# Patient Record
Sex: Male | Born: 1945 | Race: White | Hispanic: No | Marital: Married | Smoking: Former smoker
Health system: Southern US, Community
[De-identification: ages and names within clinical notes are randomized; demographics above are authoritative.]

## PROBLEM LIST (undated history)

## (undated) DIAGNOSIS — I10 Essential (primary) hypertension: Secondary | ICD-10-CM

## (undated) DIAGNOSIS — K602 Anal fissure, unspecified: Secondary | ICD-10-CM

## (undated) DIAGNOSIS — M1990 Unspecified osteoarthritis, unspecified site: Secondary | ICD-10-CM

## (undated) DIAGNOSIS — I219 Acute myocardial infarction, unspecified: Secondary | ICD-10-CM

## (undated) DIAGNOSIS — N200 Calculus of kidney: Secondary | ICD-10-CM

## (undated) DIAGNOSIS — J189 Pneumonia, unspecified organism: Secondary | ICD-10-CM

## (undated) HISTORY — PX: TONSILLECTOMY: SUR1361

---

## 2005-12-21 HISTORY — PX: JOINT REPLACEMENT: SHX530

## 2006-01-08 ENCOUNTER — Inpatient Hospital Stay (HOSPITAL_COMMUNITY): Admission: RE | Admit: 2006-01-08 | Discharge: 2006-01-10 | Payer: Self-pay | Admitting: Orthopedic Surgery

## 2006-01-14 ENCOUNTER — Emergency Department (HOSPITAL_COMMUNITY): Admission: EM | Admit: 2006-01-14 | Discharge: 2006-01-14 | Payer: Self-pay | Admitting: Emergency Medicine

## 2006-05-02 ENCOUNTER — Inpatient Hospital Stay (HOSPITAL_COMMUNITY): Admission: RE | Admit: 2006-05-02 | Discharge: 2006-05-04 | Payer: Self-pay | Admitting: Orthopedic Surgery

## 2006-05-08 ENCOUNTER — Ambulatory Visit (HOSPITAL_COMMUNITY): Admission: RE | Admit: 2006-05-08 | Discharge: 2006-05-08 | Payer: Self-pay | Admitting: Orthopedic Surgery

## 2006-09-23 IMAGING — CT CT ANGIO CHEST
2 of 4 series · 19 of 36 positions shown · IV contrast (APPLIED)
Comparison: none

CLINICAL DATA: 60-year-old with lower right back pain.  Recent right knee surgery. Elevated D-dimer. 
 CT ANGIOGRAPHY OF CHEST:
TECHNIQUE: Multidetector CT imaging of the chest was performed during bolus injection of intravenous contrast.  Multiplanar CT angiographic image reconstructions were generated to evaluate the vascular anatomy.
 Contrast:  125 cc Omnipaque 300.

[Series 5: pe 1.0 b20f st · axial · 0.72mm/px · z∈[+1137,+1441]mm · 16 of 338 slices shown]
[im 17/338  lung]
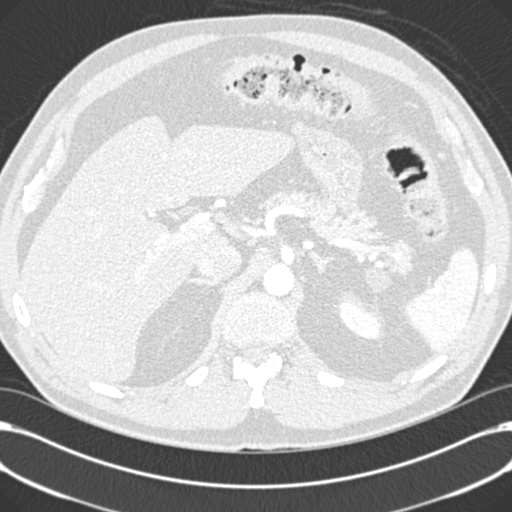
[im 34/338  mediastinal]
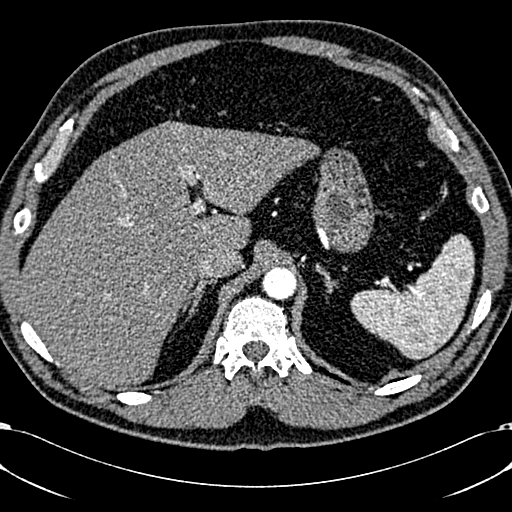
[im 51/338  lung]
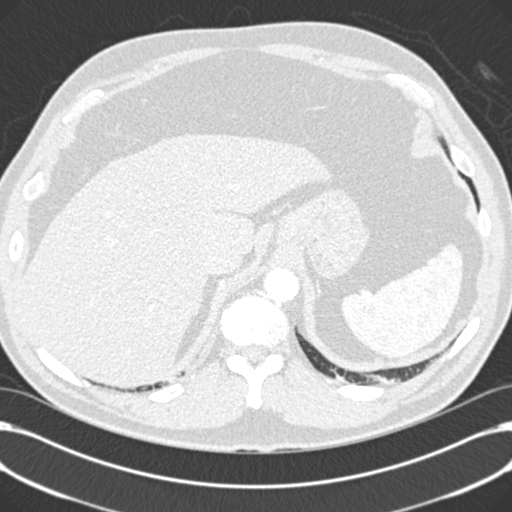
[im 85/338  mediastinal]
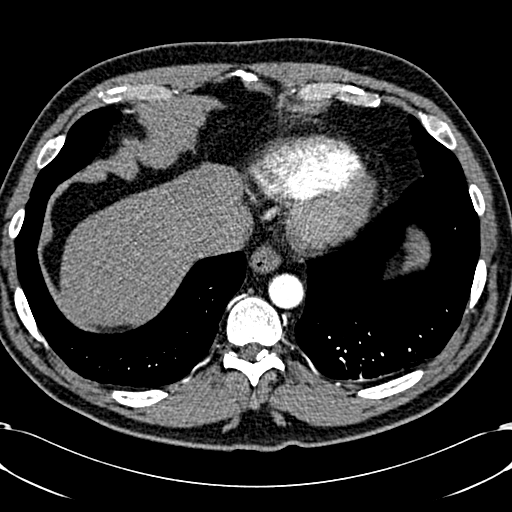
[im 102/338  lung]
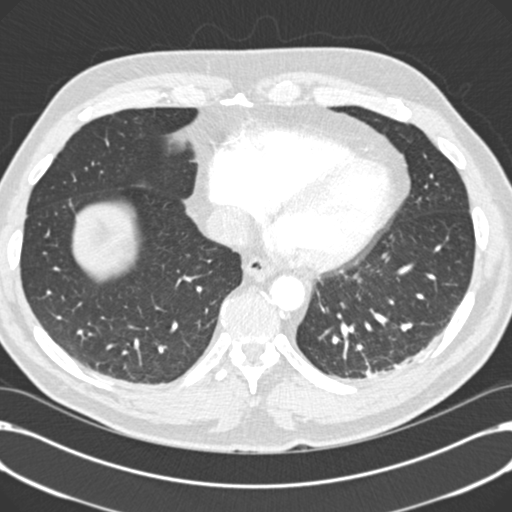
[im 118/338  mediastinal]
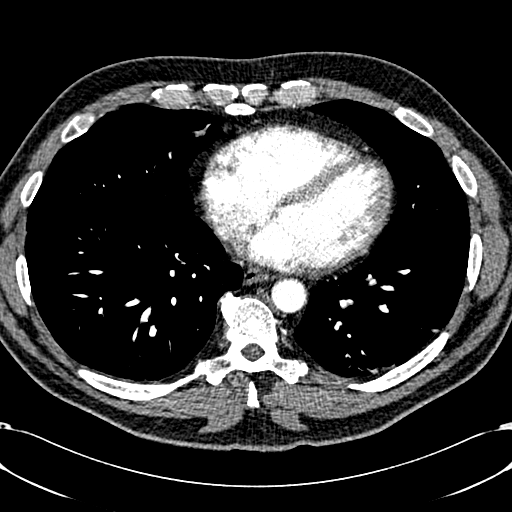
[im 135/338  lung]
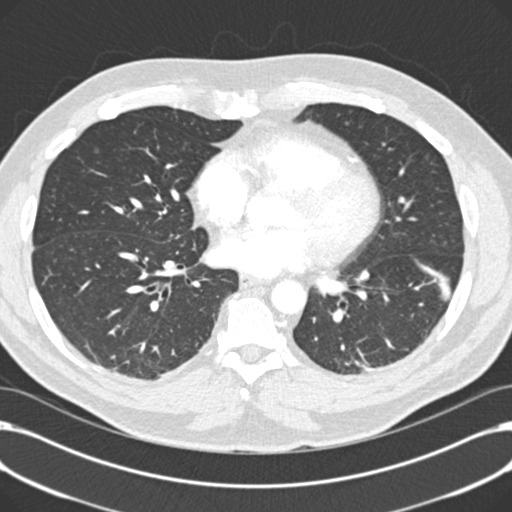
[im 152/338  mediastinal]
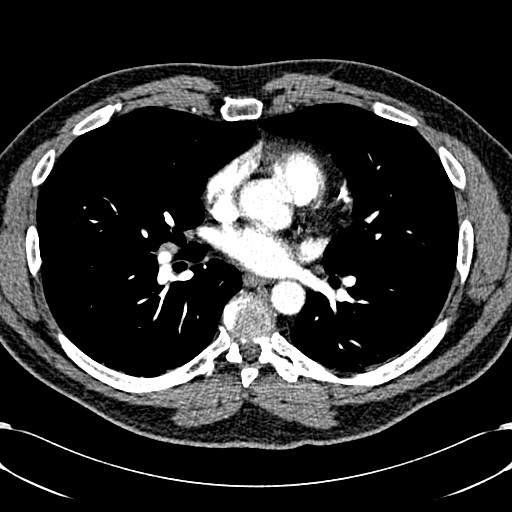
[im 186/338  lung]
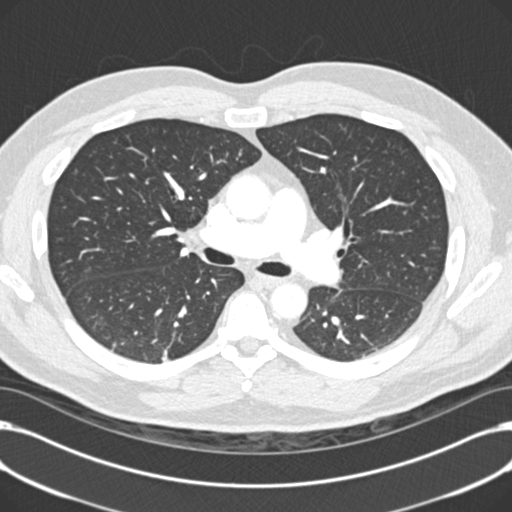
[im 203/338  mediastinal]
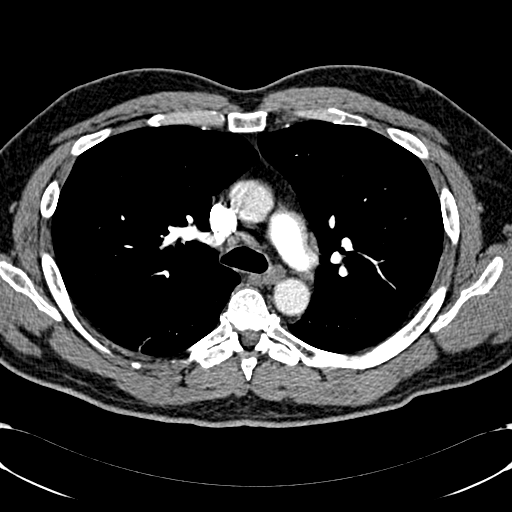
[im 220/338  lung]
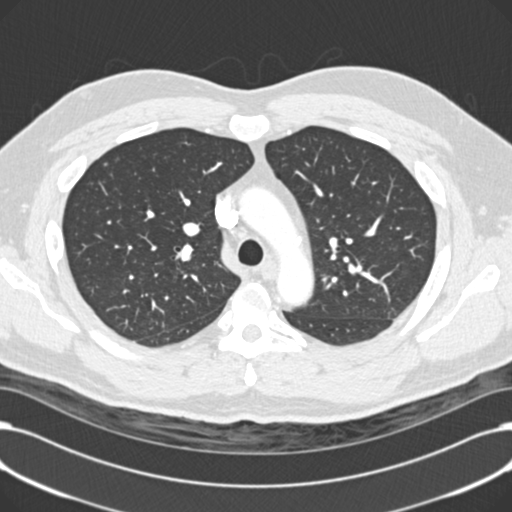
[im 236/338  mediastinal]
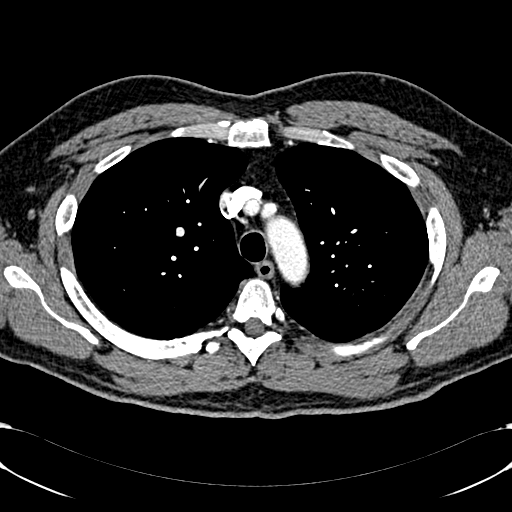
[im 253/338  lung]
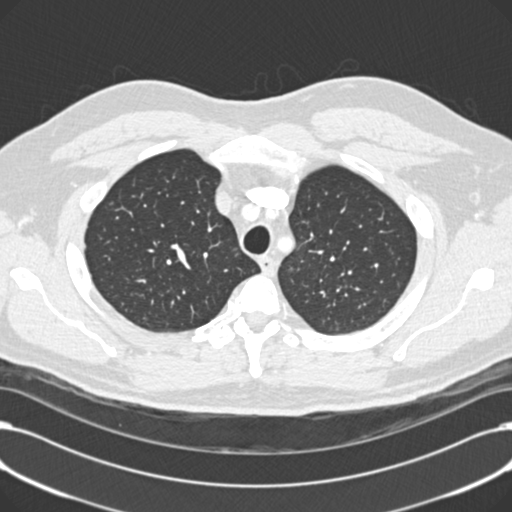
[im 287/338  mediastinal]
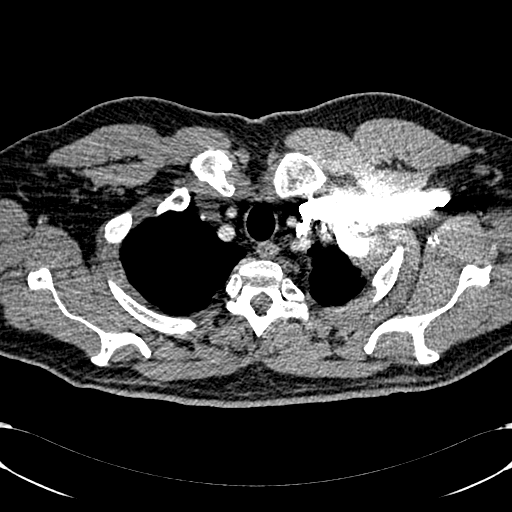
[im 304/338  lung]
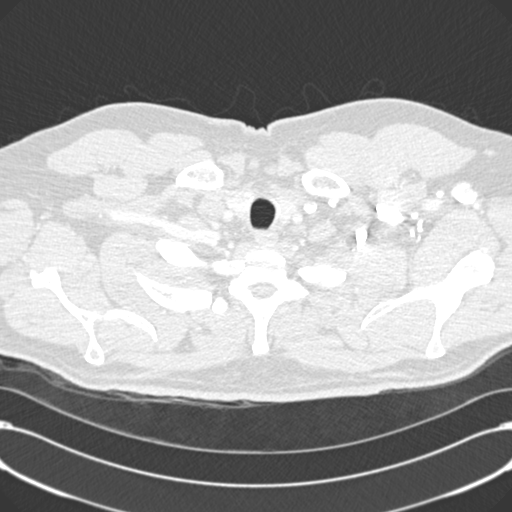
[im 321/338  mediastinal]
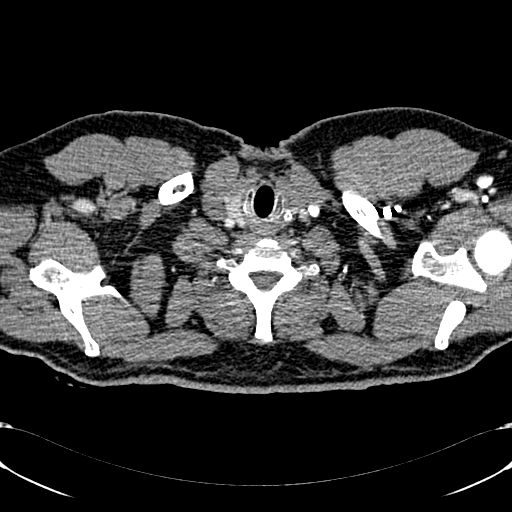

[Series 602: coronal · coronal · 0.72mm/px · 3 of 39 slices shown]
[im 8/39  mediastinal]
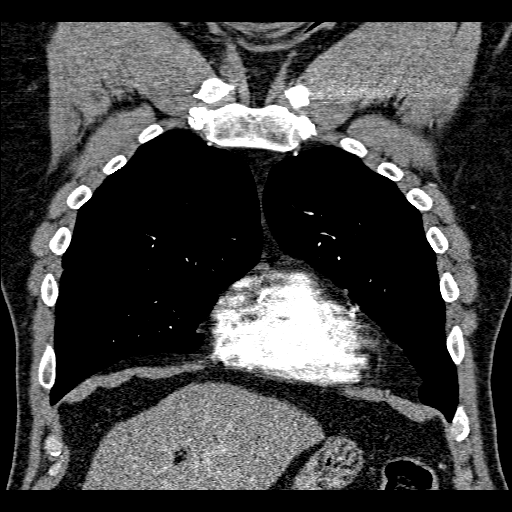
[im 16/39  mediastinal]
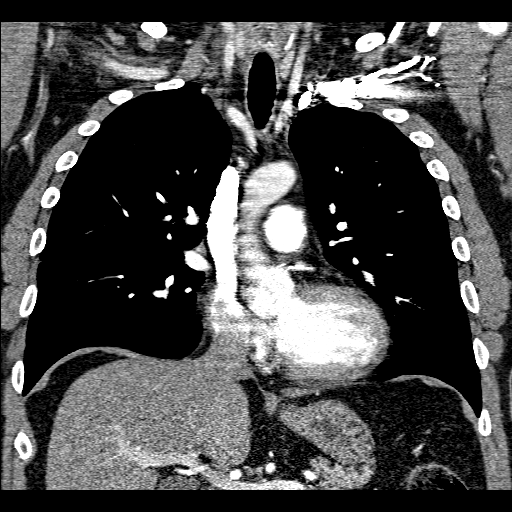
[im 23/39  mediastinal]
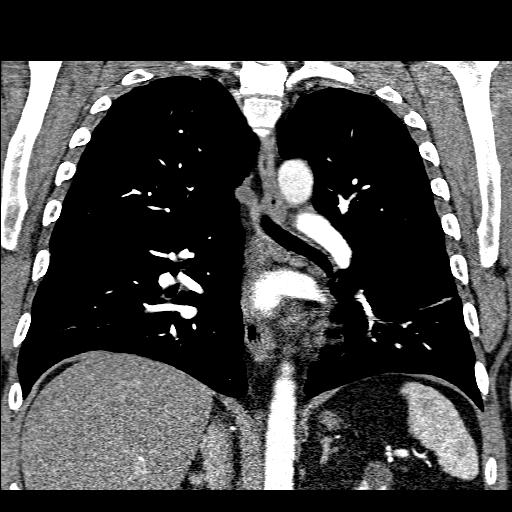

[19 of 36 positions shown; findings below may reference images not displayed]

FINDINGS: There is mediastinal, hilar, or axillary adenoma.  Vessels are well opacified, and there is no evidence for acute pulmonary embolus.  Lung windows show no nodules, pleural effusions, or infiltrates. Images of the upper abdomen show a left renal cyst and are otherwise unremarkable.  There are degenerative changes in the spine.
IMPRESSION: 1.  No evidence for acute pulmonary embolus. 
 2.  Small bilateral hilar lymph nodes that are nonspecific.

## 2007-11-21 HISTORY — PX: COLONOSCOPY: SHX174

## 2012-04-02 ENCOUNTER — Other Ambulatory Visit (HOSPITAL_COMMUNITY): Payer: Self-pay | Admitting: Orthopedic Surgery

## 2012-04-02 DIAGNOSIS — T84038A Mechanical loosening of other internal prosthetic joint, initial encounter: Secondary | ICD-10-CM

## 2012-04-02 DIAGNOSIS — M25562 Pain in left knee: Secondary | ICD-10-CM

## 2012-04-02 DIAGNOSIS — M25561 Pain in right knee: Secondary | ICD-10-CM

## 2012-04-05 ENCOUNTER — Encounter (HOSPITAL_COMMUNITY)
Admission: RE | Admit: 2012-04-05 | Discharge: 2012-04-05 | Disposition: A | Discharge status: Home / Self Care | Payer: MEDICARE | Source: Ambulatory Visit | Attending: Orthopedic Surgery | Admitting: Orthopedic Surgery

## 2012-04-05 DIAGNOSIS — M25569 Pain in unspecified knee: Secondary | ICD-10-CM | POA: Insufficient documentation

## 2012-04-05 DIAGNOSIS — Z96659 Presence of unspecified artificial knee joint: Secondary | ICD-10-CM | POA: Insufficient documentation

## 2012-04-05 DIAGNOSIS — M6580 Other synovitis and tenosynovitis, unspecified site: Secondary | ICD-10-CM | POA: Insufficient documentation

## 2012-04-05 DIAGNOSIS — M25561 Pain in right knee: Secondary | ICD-10-CM

## 2012-04-05 MED ORDER — TECHNETIUM TC 99M MEDRONATE IV KIT
25.0000 | PACK | Freq: Once | INTRAVENOUS | Status: AC | PRN
  Administered 2012-04-05: 25 via INTRAVENOUS

## 2012-05-16 ENCOUNTER — Other Ambulatory Visit: Payer: Self-pay | Admitting: Orthopedic Surgery

## 2012-05-16 DIAGNOSIS — M25569 Pain in unspecified knee: Secondary | ICD-10-CM

## 2012-05-20 ENCOUNTER — Ambulatory Visit
Admission: RE | Admit: 2012-05-20 | Discharge: 2012-05-20 | Disposition: A | Discharge status: Home / Self Care | Payer: BC Managed Care – PPO | Source: Ambulatory Visit | Attending: Orthopedic Surgery | Admitting: Orthopedic Surgery

## 2012-05-20 DIAGNOSIS — M25569 Pain in unspecified knee: Secondary | ICD-10-CM

## 2012-05-20 HISTORY — PX: CARDIAC CATHETERIZATION: SHX172

## 2012-05-20 MED ORDER — IOHEXOL 300 MG/ML  SOLN
125.0000 mL | Freq: Once | INTRAMUSCULAR | Status: AC | PRN
  Administered 2012-05-20: 125 mL via INTRAVENOUS

## 2012-10-01 ENCOUNTER — Other Ambulatory Visit (HOSPITAL_COMMUNITY): Payer: Self-pay | Admitting: Orthopedic Surgery

## 2012-10-01 DIAGNOSIS — M25561 Pain in right knee: Secondary | ICD-10-CM

## 2012-10-09 ENCOUNTER — Encounter (HOSPITAL_COMMUNITY)
Admission: RE | Admit: 2012-10-09 | Discharge: 2012-10-09 | Disposition: A | Discharge status: Home / Self Care | Payer: MEDICARE | Source: Ambulatory Visit | Attending: Orthopedic Surgery | Admitting: Orthopedic Surgery

## 2012-10-09 DIAGNOSIS — Z96659 Presence of unspecified artificial knee joint: Secondary | ICD-10-CM | POA: Insufficient documentation

## 2012-10-09 DIAGNOSIS — M25561 Pain in right knee: Secondary | ICD-10-CM

## 2012-10-09 DIAGNOSIS — M25569 Pain in unspecified knee: Secondary | ICD-10-CM | POA: Insufficient documentation

## 2012-10-09 MED ORDER — TECHNETIUM TC 99M MEDRONATE IV KIT
25.0000 | PACK | Freq: Once | INTRAVENOUS | Status: AC | PRN
  Administered 2012-10-09: 25 via INTRAVENOUS

## 2012-11-20 DIAGNOSIS — K602 Anal fissure, unspecified: Secondary | ICD-10-CM

## 2012-11-20 HISTORY — DX: Anal fissure, unspecified: K60.2

## 2013-06-19 ENCOUNTER — Other Ambulatory Visit: Payer: Self-pay | Admitting: Orthopedic Surgery

## 2013-06-25 ENCOUNTER — Encounter (HOSPITAL_COMMUNITY): Payer: Self-pay | Admitting: Pharmacy Technician

## 2013-06-27 ENCOUNTER — Encounter (HOSPITAL_COMMUNITY)
Admission: RE | Admit: 2013-06-27 | Discharge: 2013-06-27 | Disposition: A | Discharge status: Home / Self Care | Payer: BC Managed Care – PPO | Source: Ambulatory Visit | Attending: Orthopedic Surgery | Admitting: Orthopedic Surgery

## 2013-06-27 ENCOUNTER — Encounter (HOSPITAL_COMMUNITY): Payer: Self-pay

## 2013-06-27 DIAGNOSIS — Z01812 Encounter for preprocedural laboratory examination: Secondary | ICD-10-CM | POA: Insufficient documentation

## 2013-06-27 DIAGNOSIS — Z01818 Encounter for other preprocedural examination: Secondary | ICD-10-CM | POA: Insufficient documentation

## 2013-06-27 HISTORY — DX: Acute myocardial infarction, unspecified: I21.9

## 2013-06-27 HISTORY — DX: Essential (primary) hypertension: I10

## 2013-06-27 HISTORY — DX: Calculus of kidney: N20.0

## 2013-06-27 HISTORY — DX: Unspecified osteoarthritis, unspecified site: M19.90

## 2013-06-27 HISTORY — DX: Anal fissure, unspecified: K60.2

## 2013-06-27 HISTORY — DX: Pneumonia, unspecified organism: J18.9

## 2013-06-27 LAB — COMPREHENSIVE METABOLIC PANEL
ALT: 70 U/L — ABNORMAL HIGH (ref 0–53)
Calcium: 9.8 mg/dL (ref 8.4–10.5)
Creatinine, Ser: 1.06 mg/dL (ref 0.50–1.35)
GFR calc Af Amer: 81 mL/min — ABNORMAL LOW (ref >90)
Glucose, Bld: 96 mg/dL (ref 70–99)
Sodium: 137 mEq/L (ref 135–145)
Total Protein: 7.8 g/dL (ref 6.0–8.3)

## 2013-06-27 LAB — URINALYSIS, ROUTINE W REFLEX MICROSCOPIC
Glucose, UA: NEGATIVE mg/dL
Ketones, ur: NEGATIVE mg/dL
Leukocytes, UA: NEGATIVE
Nitrite: NEGATIVE
Protein, ur: NEGATIVE mg/dL
Urobilinogen, UA: 0.2 mg/dL (ref 0.0–1.0)

## 2013-06-27 LAB — SURGICAL PCR SCREEN
MRSA, PCR: NEGATIVE
Staphylococcus aureus: POSITIVE — AB

## 2013-06-27 LAB — CBC WITH DIFFERENTIAL/PLATELET
Basophils Absolute: 0.1 10*3/uL (ref 0.0–0.1)
Eosinophils Absolute: 0.1 10*3/uL (ref 0.0–0.7)
Eosinophils Relative: 1 % (ref 0–5)
Lymphs Abs: 3 10*3/uL (ref 0.7–4.0)
MCH: 34.8 pg — ABNORMAL HIGH (ref 26.0–34.0)
MCV: 96.4 fL (ref 78.0–100.0)
Monocytes Absolute: 0.9 10*3/uL (ref 0.1–1.0)
Platelets: 238 10*3/uL (ref 150–400)
RDW: 12.7 % (ref 11.5–15.5)

## 2013-06-27 LAB — TYPE AND SCREEN
ABO/RH(D): O POS
Antibody Screen: NEGATIVE

## 2013-06-27 LAB — PROTIME-INR
INR: 0.96 (ref 0.00–1.49)
Prothrombin Time: 12.6 seconds (ref 11.6–15.2)

## 2013-06-27 LAB — URINE MICROSCOPIC-ADD ON

## 2013-06-27 NOTE — Pre-Procedure Instructions (Signed)
KENNTH VANBENSCHOTEN  06/27/2013   Your procedure is scheduled on:  Monday, August 18th  Report to [name] at 0730 AM.  Call this number if you have problems the morning of surgery: [phone]   Remember:   Do not eat food or drink liquids after midnight.   Take these medicines the morning of surgery with A SIP OF WATER: atenolol, tylenol if needed   Do not wear jewelry.  Do not wear lotions, powders, or perfume, deodorant.  Do not shave 48 hours prior to surgery. Men may shave face and neck.  Do not bring valuables to the hospital.  [facility] is not responsible  for any belongings or valuables.  Contacts, dentures or bridgework may not be worn into surgery.  Leave suitcase in the car. After surgery it may be brought to your room.  For patients admitted to the hospital, checkout time is 11:00 AM the day of discharge.   Patients discharged the day of surgery will not be allowed to drive home.    Special Instructions: Shower using CHG 2 nights before surgery and the night before surgery.  If you shower the day of surgery use CHG.  Use special wash - you have one bottle of CHG for all showers.  You should use approximately 1/3 of the bottle for each shower.   Please read over the following fact sheets that you were given: Pain Booklet, Coughing and Deep Breathing, Blood Transfusion Information, MRSA Information and Surgical Site Infection Prevention

## 2013-06-27 NOTE — Progress Notes (Signed)
Pt. Cardiologist is Dr. Garner Nash at [facility] in [place]. Requested ekg,echo,notes,cath ,stress test. Also requested cxr for primary md, Dr. Fredrich Birks [phone]. Also faxed to him sleep apnea screening tool assessment.

## 2013-06-27 NOTE — Progress Notes (Signed)
06/27/13 1221  OBSTRUCTIVE SLEEP APNEA  Have you ever been diagnosed with sleep apnea through a sleep study? No  Do you snore loudly (loud enough to be heard through closed doors)?  1  Do you often feel tired, fatigued, or sleepy during the daytime? 1  Has anyone observed you stop breathing during your sleep? 1  Do you have, or are you being treated for high blood pressure? 1  BMI more than 35 kg/m2? 1  Age over 69 years old? 1  Neck circumference greater than 40 cm/18 inches? 1  Gender: 1  Obstructive Sleep Apnea Score 8  Score 4 or greater  Results sent to PCP

## 2013-06-29 LAB — URINE CULTURE

## 2013-06-30 NOTE — Progress Notes (Signed)
I spoke with Madison at Dr [facility] office and asked for Mupirocin ointment be call in for patient- postive staph-PCR.

## 2013-07-03 NOTE — Progress Notes (Signed)
Anesthesia Chart Review:  Patient is a 67 year old male scheduled for left TK revision on 07/07/13 by dr. Sherlean Foot.  History includes HTN, CAD/MI s/p DES LAD 06/03/12, nephrolithiasis, anal fissure, morbid obesity, TKR '07.  Smoking status was not assessed at PAT.  OSA screening score was 8.  PCP is Dr. Bobbye Charleston.    Cardiologist is Dr. Tollie Pizza with [facility].  He was seen for a preoperative evaluation on 06/05/13.  EKG at that visit showed NSR.  It was felt he could proceed with surgery without further cardiac testing and would be low risk for perioperative CV events.  Echo on 11/18/12 [facility]) showed normal LV size and thickness, normal LV function, EF 60-65%, mild MR, PASP 45 mmHG, mild pulmonary hypertension.  Cardiac cath on 06/03/12 ([facility]) showed normal LM, mild irregularities of the LCx and RCA, 80% mild LAD s/p DES. EF 70%.  CXR from [facility] (Dr. Fredrich Birks) on 05/29/13 showed no radiographic evidence of acute cardiopulmonary disease, aortic atherosclerosis.  Preoperative labs noted.  AST 49, ALT 70.  These are not > 2 X normal and PLT and PT/PTT WNL, so I will not plan to repeat labs on the day of surgery. Urine culture showed no growth.  He is now s/p > 1 year DES and was cleared by cardiology.  Anticipate he can proceed if no acute changes.  Velna Ochs [facility]/Anesthesiology Phone [phone] 07/03/2013 12:11 PM

## 2013-07-06 MED ORDER — DEXTROSE 5 % IV SOLN
3.0000 g | INTRAVENOUS | Status: AC
  Administered 2013-07-07: 3 g via INTRAVENOUS
  Filled 2013-07-06: qty 3000

## 2013-07-07 ENCOUNTER — Encounter (HOSPITAL_COMMUNITY): Payer: Self-pay | Admitting: Vascular Surgery

## 2013-07-07 ENCOUNTER — Inpatient Hospital Stay (HOSPITAL_COMMUNITY): Payer: MEDICARE

## 2013-07-07 ENCOUNTER — Inpatient Hospital Stay (HOSPITAL_COMMUNITY)
Admission: RE | Admit: 2013-07-07 | Discharge: 2013-07-08 | DRG: 467 | Disposition: A | Discharge status: Home Health | Payer: MEDICARE | Source: Ambulatory Visit | Attending: Orthopedic Surgery | Admitting: Orthopedic Surgery

## 2013-07-07 ENCOUNTER — Inpatient Hospital Stay (HOSPITAL_COMMUNITY): Payer: MEDICARE | Admitting: Anesthesiology

## 2013-07-07 ENCOUNTER — Encounter (HOSPITAL_COMMUNITY)
Admission: RE | Disposition: A | Discharge status: Home Health | Payer: Self-pay | Source: Ambulatory Visit | Attending: Orthopedic Surgery

## 2013-07-07 ENCOUNTER — Encounter (HOSPITAL_COMMUNITY): Payer: Self-pay | Admitting: *Deleted

## 2013-07-07 DIAGNOSIS — M1710 Unilateral primary osteoarthritis, unspecified knee: Secondary | ICD-10-CM | POA: Diagnosis present

## 2013-07-07 DIAGNOSIS — Y831 Surgical operation with implant of artificial internal device as the cause of abnormal reaction of the patient, or of later complication, without mention of misadventure at the time of the procedure: Secondary | ICD-10-CM | POA: Diagnosis present

## 2013-07-07 DIAGNOSIS — Z96659 Presence of unspecified artificial knee joint: Secondary | ICD-10-CM

## 2013-07-07 DIAGNOSIS — T84099A Other mechanical complication of unspecified internal joint prosthesis, initial encounter: Secondary | ICD-10-CM | POA: Diagnosis present

## 2013-07-07 DIAGNOSIS — M25569 Pain in unspecified knee: Secondary | ICD-10-CM | POA: Diagnosis present

## 2013-07-07 DIAGNOSIS — Z01812 Encounter for preprocedural laboratory examination: Secondary | ICD-10-CM

## 2013-07-07 DIAGNOSIS — D62 Acute posthemorrhagic anemia: Secondary | ICD-10-CM | POA: Diagnosis not present

## 2013-07-07 DIAGNOSIS — I10 Essential (primary) hypertension: Secondary | ICD-10-CM | POA: Diagnosis present

## 2013-07-07 DIAGNOSIS — T8489XA Other specified complication of internal orthopedic prosthetic devices, implants and grafts, initial encounter: Principal | ICD-10-CM | POA: Diagnosis present

## 2013-07-07 DIAGNOSIS — T84093D Other mechanical complication of internal left knee prosthesis, subsequent encounter: Secondary | ICD-10-CM

## 2013-07-07 HISTORY — PX: TOTAL KNEE REVISION: SHX996

## 2013-07-07 SURGERY — TOTAL KNEE REVISION
Anesthesia: General | Site: Knee | Laterality: Left | Wound class: Clean

## 2013-07-07 MED ORDER — METHOCARBAMOL 500 MG PO TABS
ORAL_TABLET | ORAL | Status: AC
  Filled 2013-07-07: qty 1

## 2013-07-07 MED ORDER — LACTATED RINGERS IV SOLN
INTRAVENOUS | Status: DC
  Administered 2013-07-07 (×2): via INTRAVENOUS

## 2013-07-07 MED ORDER — CLOPIDOGREL BISULFATE 75 MG PO TABS
75.0000 mg | ORAL_TABLET | Freq: Every day | ORAL | Status: DC
  Administered 2013-07-08: 75 mg via ORAL
  Filled 2013-07-07 (×2): qty 1

## 2013-07-07 MED ORDER — BISACODYL 5 MG PO TBEC: 5.0000 mg | DELAYED_RELEASE_TABLET | Freq: Every day | ORAL | Status: DC | PRN

## 2013-07-07 MED ORDER — HYDROMORPHONE HCL PF 1 MG/ML IJ SOLN
1.0000 mg | INTRAMUSCULAR | Status: DC | PRN
  Administered 2013-07-07: 1 mg via INTRAVENOUS
  Filled 2013-07-07: qty 1

## 2013-07-07 MED ORDER — ATENOLOL 25 MG PO TABS
25.0000 mg | ORAL_TABLET | Freq: Every day | ORAL | Status: DC
  Administered 2013-07-08: 25 mg via ORAL
  Filled 2013-07-07: qty 1

## 2013-07-07 MED ORDER — OXYCODONE HCL 5 MG PO TABS
5.0000 mg | ORAL_TABLET | ORAL | Status: DC | PRN
  Administered 2013-07-07 – 2013-07-08 (×6): 10 mg via ORAL
  Filled 2013-07-07 (×5): qty 2

## 2013-07-07 MED ORDER — BUPIVACAINE-EPINEPHRINE PF 0.5-1:200000 % IJ SOLN
INTRAMUSCULAR | Status: DC | PRN
  Administered 2013-07-07: 25 mL

## 2013-07-07 MED ORDER — DIPHENHYDRAMINE HCL 12.5 MG/5ML PO ELIX: 12.5000 mg | ORAL_SOLUTION | ORAL | Status: DC | PRN

## 2013-07-07 MED ORDER — DEXAMETHASONE SODIUM PHOSPHATE 4 MG/ML IJ SOLN
INTRAMUSCULAR | Status: DC | PRN
  Administered 2013-07-07: 4 mg

## 2013-07-07 MED ORDER — NITROGLYCERIN 0.4 MG SL SUBL: 0.4000 mg | SUBLINGUAL_TABLET | SUBLINGUAL | Status: DC | PRN

## 2013-07-07 MED ORDER — ACETAMINOPHEN 325 MG PO TABS: 650.0000 mg | ORAL_TABLET | Freq: Four times a day (QID) | ORAL | Status: DC | PRN

## 2013-07-07 MED ORDER — MIDAZOLAM HCL 2 MG/2ML IJ SOLN
1.0000 mg | INTRAMUSCULAR | Status: DC | PRN
  Administered 2013-07-07: 2 mg via INTRAVENOUS
  Filled 2013-07-07: qty 2

## 2013-07-07 MED ORDER — METHOCARBAMOL 500 MG PO TABS
500.0000 mg | ORAL_TABLET | Freq: Four times a day (QID) | ORAL | Status: DC | PRN
  Administered 2013-07-07 – 2013-07-08 (×2): 500 mg via ORAL
  Filled 2013-07-07: qty 1

## 2013-07-07 MED ORDER — PROMETHAZINE HCL 25 MG/ML IJ SOLN: 6.2500 mg | INTRAMUSCULAR | Status: DC | PRN

## 2013-07-07 MED ORDER — PROPOFOL 10 MG/ML IV BOLUS
INTRAVENOUS | Status: DC | PRN
  Administered 2013-07-07: 200 mg via INTRAVENOUS

## 2013-07-07 MED ORDER — MIDAZOLAM HCL 2 MG/2ML IJ SOLN: 1.0000 mg | INTRAMUSCULAR | Status: DC | PRN

## 2013-07-07 MED ORDER — METOCLOPRAMIDE HCL 5 MG/ML IJ SOLN: 5.0000 mg | Freq: Three times a day (TID) | INTRAMUSCULAR | Status: DC | PRN

## 2013-07-07 MED ORDER — ENALAPRIL MALEATE 20 MG PO TABS
20.0000 mg | ORAL_TABLET | Freq: Every day | ORAL | Status: DC
  Administered 2013-07-08: 20 mg via ORAL
  Filled 2013-07-07: qty 1

## 2013-07-07 MED ORDER — HYDROMORPHONE HCL PF 1 MG/ML IJ SOLN
INTRAMUSCULAR | Status: AC
  Filled 2013-07-07: qty 1

## 2013-07-07 MED ORDER — HYDROCHLOROTHIAZIDE 25 MG PO TABS
25.0000 mg | ORAL_TABLET | Freq: Every day | ORAL | Status: DC
  Administered 2013-07-08: 25 mg via ORAL
  Filled 2013-07-07: qty 1

## 2013-07-07 MED ORDER — CELECOXIB 200 MG PO CAPS
ORAL_CAPSULE | ORAL | Status: AC
  Filled 2013-07-07: qty 1

## 2013-07-07 MED ORDER — ALUM & MAG HYDROXIDE-SIMETH 200-200-20 MG/5ML PO SUSP
30.0000 mL | ORAL | Status: DC | PRN
  Administered 2013-07-07: 30 mL via ORAL
  Filled 2013-07-07: qty 30

## 2013-07-07 MED ORDER — OXYCODONE HCL 5 MG PO TABS
ORAL_TABLET | ORAL | Status: AC
  Filled 2013-07-07: qty 2

## 2013-07-07 MED ORDER — TRANEXAMIC ACID 100 MG/ML IV SOLN
1000.0000 mg | INTRAVENOUS | Status: AC
  Administered 2013-07-07: 1000 mg via INTRAVENOUS
  Filled 2013-07-07: qty 10

## 2013-07-07 MED ORDER — CELECOXIB 200 MG PO CAPS
200.0000 mg | ORAL_CAPSULE | Freq: Two times a day (BID) | ORAL | Status: DC
  Administered 2013-07-07 – 2013-07-08 (×3): 200 mg via ORAL
  Filled 2013-07-07 (×3): qty 1

## 2013-07-07 MED ORDER — ROCURONIUM BROMIDE 100 MG/10ML IV SOLN
INTRAVENOUS | Status: DC | PRN
  Administered 2013-07-07: 50 mg via INTRAVENOUS

## 2013-07-07 MED ORDER — FENTANYL CITRATE 0.05 MG/ML IJ SOLN
INTRAMUSCULAR | Status: DC | PRN
  Administered 2013-07-07 (×2): 50 ug via INTRAVENOUS
  Administered 2013-07-07: 100 ug via INTRAVENOUS
  Administered 2013-07-07: 50 ug via INTRAVENOUS

## 2013-07-07 MED ORDER — ONDANSETRON HCL 4 MG/2ML IJ SOLN
INTRAMUSCULAR | Status: DC | PRN
  Administered 2013-07-07: 4 mg via INTRAVENOUS

## 2013-07-07 MED ORDER — PHENOL 1.4 % MT LIQD: 1.0000 | OROMUCOSAL | Status: DC | PRN

## 2013-07-07 MED ORDER — BUPIVACAINE LIPOSOME 1.3 % IJ SUSP
20.0000 mL | Freq: Once | INTRAMUSCULAR | Status: DC
  Filled 2013-07-07: qty 20

## 2013-07-07 MED ORDER — MENTHOL 3 MG MT LOZG: 1.0000 | LOZENGE | OROMUCOSAL | Status: DC | PRN

## 2013-07-07 MED ORDER — ATORVASTATIN CALCIUM 40 MG PO TABS
40.0000 mg | ORAL_TABLET | Freq: Every day | ORAL | Status: DC
  Administered 2013-07-07 – 2013-07-08 (×2): 40 mg via ORAL
  Filled 2013-07-07 (×2): qty 1

## 2013-07-07 MED ORDER — SODIUM CHLORIDE 0.9 % IR SOLN
Status: DC | PRN
  Administered 2013-07-07: 1000 mL
  Administered 2013-07-07: 3000 mL

## 2013-07-07 MED ORDER — BUPIVACAINE HCL (PF) 0.5 % IJ SOLN
INTRAMUSCULAR | Status: AC
  Filled 2013-07-07: qty 30

## 2013-07-07 MED ORDER — FLEET ENEMA 7-19 GM/118ML RE ENEM: 1.0000 | ENEMA | Freq: Once | RECTAL | Status: AC | PRN

## 2013-07-07 MED ORDER — ONDANSETRON HCL 4 MG/2ML IJ SOLN: 4.0000 mg | Freq: Four times a day (QID) | INTRAMUSCULAR | Status: DC | PRN

## 2013-07-07 MED ORDER — ZOLPIDEM TARTRATE 5 MG PO TABS: 5.0000 mg | ORAL_TABLET | Freq: Every evening | ORAL | Status: DC | PRN

## 2013-07-07 MED ORDER — METOCLOPRAMIDE HCL 10 MG PO TABS: 5.0000 mg | ORAL_TABLET | Freq: Three times a day (TID) | ORAL | Status: DC | PRN

## 2013-07-07 MED ORDER — NEOSTIGMINE METHYLSULFATE 1 MG/ML IJ SOLN
INTRAMUSCULAR | Status: DC | PRN
  Administered 2013-07-07: 2.5 mg via INTRAVENOUS

## 2013-07-07 MED ORDER — SODIUM CHLORIDE 0.9 % IJ SOLN
INTRAMUSCULAR | Status: DC | PRN
  Administered 2013-07-07: 13:00:00

## 2013-07-07 MED ORDER — ONDANSETRON HCL 4 MG PO TABS: 4.0000 mg | ORAL_TABLET | Freq: Four times a day (QID) | ORAL | Status: DC | PRN

## 2013-07-07 MED ORDER — METHOCARBAMOL 100 MG/ML IJ SOLN
500.0000 mg | Freq: Four times a day (QID) | INTRAVENOUS | Status: DC | PRN
  Filled 2013-07-07: qty 5

## 2013-07-07 MED ORDER — OXYCODONE HCL ER 20 MG PO T12A
20.0000 mg | EXTENDED_RELEASE_TABLET | Freq: Two times a day (BID) | ORAL | Status: DC
  Administered 2013-07-07 – 2013-07-08 (×2): 20 mg via ORAL
  Filled 2013-07-07 (×2): qty 1

## 2013-07-07 MED ORDER — ACETAMINOPHEN 650 MG RE SUPP: 650.0000 mg | Freq: Four times a day (QID) | RECTAL | Status: DC | PRN

## 2013-07-07 MED ORDER — SENNOSIDES-DOCUSATE SODIUM 8.6-50 MG PO TABS: 1.0000 | ORAL_TABLET | Freq: Every evening | ORAL | Status: DC | PRN

## 2013-07-07 MED ORDER — BUPIVACAINE HCL 0.5 % IJ SOLN
INTRAMUSCULAR | Status: DC | PRN
  Administered 2013-07-07: 30 mL

## 2013-07-07 MED ORDER — HYDROMORPHONE HCL PF 1 MG/ML IJ SOLN
0.2500 mg | INTRAMUSCULAR | Status: DC | PRN
  Administered 2013-07-07: 0.5 mg via INTRAVENOUS

## 2013-07-07 MED ORDER — FENTANYL CITRATE 0.05 MG/ML IJ SOLN
50.0000 ug | Freq: Once | INTRAMUSCULAR | Status: AC
  Administered 2013-07-07: 100 ug via INTRAVENOUS
  Filled 2013-07-07: qty 2

## 2013-07-07 MED ORDER — CEFAZOLIN SODIUM-DEXTROSE 2-3 GM-% IV SOLR
2.0000 g | Freq: Four times a day (QID) | INTRAVENOUS | Status: AC
  Administered 2013-07-07 (×2): 2 g via INTRAVENOUS
  Filled 2013-07-07 (×2): qty 50

## 2013-07-07 MED ORDER — SODIUM CHLORIDE 0.9 % IV SOLN: INTRAVENOUS | Status: DC

## 2013-07-07 MED ORDER — DOCUSATE SODIUM 100 MG PO CAPS
100.0000 mg | ORAL_CAPSULE | Freq: Two times a day (BID) | ORAL | Status: DC
  Administered 2013-07-07 – 2013-07-08 (×2): 100 mg via ORAL
  Filled 2013-07-07 (×3): qty 1

## 2013-07-07 MED ORDER — FENTANYL CITRATE 0.05 MG/ML IJ SOLN: 50.0000 ug | INTRAMUSCULAR | Status: DC | PRN

## 2013-07-07 MED ORDER — ACETAMINOPHEN 500 MG PO TABS
1000.0000 mg | ORAL_TABLET | Freq: Once | ORAL | Status: DC
Start: 2013-07-07 — End: 2013-07-07

## 2013-07-07 MED ORDER — GLYCOPYRROLATE 0.2 MG/ML IJ SOLN
INTRAMUSCULAR | Status: DC | PRN
  Administered 2013-07-07: .5 mg via INTRAVENOUS

## 2013-07-07 SURGICAL SUPPLY — 93 items
ARTICULAR SURFACE KNEE 14MM (Knees) ×1 IMPLANT
AUGMENT BLOCK POST SZ G 5MM (Orthopedic Implant) ×1 IMPLANT
BANDAGE ESMARK 6X9 LF (GAUZE/BANDAGES/DRESSINGS) ×1 IMPLANT
BLADE SAGITTAL 13X1.27X60 (BLADE) ×2 IMPLANT
BLADE SAW SGTL 13X75X1.27 (BLADE) ×1 IMPLANT
BLADE SAW SGTL 81X20 HD (BLADE) ×1 IMPLANT
BLADE SAW SGTL 83.5X18.5 (BLADE) ×2 IMPLANT
BLADE SAW SGTL NAR THIN XSHT (BLADE) ×1 IMPLANT
BLADE SURG 10 STRL SS (BLADE) ×4 IMPLANT
BNDG CMPR 9X6 STRL LF SNTH (GAUZE/BANDAGES/DRESSINGS) ×1
BNDG ESMARK 6X9 LF (GAUZE/BANDAGES/DRESSINGS) ×2
BONE CEMENT PALACOSE (Orthopedic Implant) ×4 IMPLANT
BOWL SMART MIX CTS (DISPOSABLE) ×1 IMPLANT
CATH KIT ON Q 5IN SLV (PAIN MANAGEMENT) ×1 IMPLANT
CEMENT BONE PALACOSE (Orthopedic Implant) ×2 IMPLANT
CEMENT BONE SIMPLEX SPEEDSET (Cement) IMPLANT
CLOTH BEACON ORANGE TIMEOUT ST (SAFETY) ×2 IMPLANT
COVER SURGICAL LIGHT HANDLE (MISCELLANEOUS) ×2 IMPLANT
CUFF TOURNIQUET SINGLE 34IN LL (TOURNIQUET CUFF) ×1 IMPLANT
DRAPE EXTREMITY T 121X128X90 (DRAPE) ×2 IMPLANT
DRAPE INCISE IOBAN 66X45 STRL (DRAPES) ×4 IMPLANT
DRAPE PROXIMA HALF (DRAPES) ×2 IMPLANT
DRAPE U-SHAPE 47X51 STRL (DRAPES) ×2 IMPLANT
DRSG ADAPTIC 3X8 NADH LF (GAUZE/BANDAGES/DRESSINGS) ×2 IMPLANT
DRSG PAD ABDOMINAL 8X10 ST (GAUZE/BANDAGES/DRESSINGS) ×2 IMPLANT
DURAPREP 26ML APPLICATOR (WOUND CARE) ×3 IMPLANT
ELECT REM PT RETURN 9FT ADLT (ELECTROSURGICAL) ×2
ELECTRODE REM PT RTRN 9FT ADLT (ELECTROSURGICAL) ×1 IMPLANT
EVACUATOR 1/8 PVC DRAIN (DRAIN) ×2 IMPLANT
FACESHIELD LNG OPTICON STERILE (SAFETY) ×1 IMPLANT
FEMORAL DISTAL AUGNEMT BLOCK (Orthopedic Implant) ×2 IMPLANT
FEMUR PROV CUT GUIDE SZ G-L (Orthopedic Implant) ×1 IMPLANT
GLOVE BIOGEL M 7.0 STRL (GLOVE) ×1 IMPLANT
GLOVE BIOGEL PI IND STRL 7.0 (GLOVE) IMPLANT
GLOVE BIOGEL PI IND STRL 7.5 (GLOVE) IMPLANT
GLOVE BIOGEL PI IND STRL 8.5 (GLOVE) ×1 IMPLANT
GLOVE BIOGEL PI INDICATOR 7.0 (GLOVE) ×1
GLOVE BIOGEL PI INDICATOR 7.5 (GLOVE) ×1
GLOVE BIOGEL PI INDICATOR 8.5 (GLOVE) ×2
GLOVE BIOGEL PI ORTHO PRO 7.5 (GLOVE) ×3
GLOVE BIOGEL PI ORTHO PRO SZ8 (GLOVE)
GLOVE PI ORTHO PRO STRL 7.5 (GLOVE) IMPLANT
GLOVE PI ORTHO PRO STRL SZ8 (GLOVE) ×1 IMPLANT
GLOVE SURG ORTHO 8.0 STRL STRW (GLOVE) ×5 IMPLANT
GLOVE SURG SS PI 6.5 STRL IVOR (GLOVE) ×1 IMPLANT
GLOVE SURG SS PI 7.5 STRL IVOR (GLOVE) ×5 IMPLANT
GOWN PREVENTION PLUS XLARGE (GOWN DISPOSABLE) ×4 IMPLANT
GOWN SRG XL XLNG 56XLVL 4 (GOWN DISPOSABLE) IMPLANT
GOWN STRL NON-REIN LRG LVL3 (GOWN DISPOSABLE) ×2 IMPLANT
GOWN STRL NON-REIN XL XLG LVL4 (GOWN DISPOSABLE) ×8
HANDPIECE INTERPULSE COAX TIP (DISPOSABLE) ×2
HOOD PEEL AWAY FACE SHEILD DIS (HOOD) ×8 IMPLANT
KIT BASIN OR (CUSTOM PROCEDURE TRAY) ×2 IMPLANT
KIT ROOM TURNOVER OR (KITS) ×2 IMPLANT
MANIFOLD NEPTUNE II (INSTRUMENTS) ×2 IMPLANT
NDL 18GX1X1/2 (RX/OR ONLY) (NEEDLE) IMPLANT
NDL HYPO 20X1 EYE RM 11 (NEEDLE) IMPLANT
NEEDLE 18GX1X1/2 (RX/OR ONLY) (NEEDLE) IMPLANT
NEEDLE 22X1 1/2 (OR ONLY) (NEEDLE) ×1 IMPLANT
NEEDLE HYPO 20X1 EYE RM 11 (NEEDLE) ×2 IMPLANT
NS IRRIG 1000ML POUR BTL (IV SOLUTION) ×2 IMPLANT
PACK TOTAL JOINT (CUSTOM PROCEDURE TRAY) ×2 IMPLANT
PAD ARMBOARD 7.5X6 YLW CONV (MISCELLANEOUS) ×3 IMPLANT
PADDING CAST COTTON 6X4 STRL (CAST SUPPLIES) ×2 IMPLANT
PATELLA LRG REPLACEMENT (Knees) ×1 IMPLANT
POSITIONER HEAD PRONE TRACH (MISCELLANEOUS) ×2 IMPLANT
SET HNDPC FAN SPRY TIP SCT (DISPOSABLE) IMPLANT
SPONGE GAUZE 4X4 12PLY (GAUZE/BANDAGES/DRESSINGS) ×2 IMPLANT
SPONGE LAP 18X18 X RAY DECT (DISPOSABLE) ×1 IMPLANT
STAPLER VISISTAT 35W (STAPLE) ×2 IMPLANT
STEM ST EXT ZIER 100X145X12X (Stem) IMPLANT
STEM ST EXT ZIER 100X145X15X (Stem) IMPLANT
STEM ST EXT ZIMMER (Stem) ×4 IMPLANT
SUCTION FRAZIER TIP 10 FR DISP (SUCTIONS) ×2 IMPLANT
SUT BONE WAX W31G (SUTURE) ×2 IMPLANT
SUT FIBERWIRE #2 38 T-5 BLUE (SUTURE) ×2
SUT PDS AB 0 CT 36 (SUTURE) IMPLANT
SUT PDS AB 1 CT  36 (SUTURE)
SUT PDS AB 1 CT 36 (SUTURE) IMPLANT
SUT PDS AB 2-0 CT1 27 (SUTURE) IMPLANT
SUT VIC AB 0 CTB1 27 (SUTURE) ×2 IMPLANT
SUT VIC AB 1 CT1 27 (SUTURE) ×6
SUT VIC AB 1 CT1 27XBRD ANBCTR (SUTURE) IMPLANT
SUT VIC AB 2-0 CTB1 (SUTURE) ×2 IMPLANT
SUTURE FIBERWR #2 38 T-5 BLUE (SUTURE) IMPLANT
SYR 20ML ECCENTRIC (SYRINGE) IMPLANT
SYR 50ML LL SCALE MARK (SYRINGE) ×1 IMPLANT
TOWEL OR 17X24 6PK STRL BLUE (TOWEL DISPOSABLE) ×2 IMPLANT
TOWEL OR 17X26 10 PK STRL BLUE (TOWEL DISPOSABLE) ×2 IMPLANT
TRAY FOLEY CATH 16FRSI W/METER (SET/KITS/TRAYS/PACK) ×1 IMPLANT
TRAY TIBIAL SZ5 (Orthopedic Implant) ×1 IMPLANT
TUBE ANAEROBIC SPECIMEN COL (MISCELLANEOUS) IMPLANT
WATER STERILE IRR 1000ML POUR (IV SOLUTION) ×4 IMPLANT

## 2013-07-07 NOTE — Progress Notes (Signed)
Orthopedic Tech Progress Note Patient Details:  [name] 07-28-1946 [id number] On cpm at 9:15 pm LLE 0-90 Patient ID: [name], male   DOB: [birth year], 67 y.o.   MRN: [id number]   [name] 07/07/2013, 9:16 PM

## 2013-07-07 NOTE — H&P (Signed)
  [name] MRN:  [id number] DOB/SEX:  1946/10/02/male  CHIEF COMPLAINT:  Painful left Knee  HISTORY: Patient is a 67 y.o. male presented with a history of pain in the left knee. Onset of symptoms was abrupt starting several months ago with gradually worsening course since that time. Prior procedures on the knee include arthroplasty. Patient has been treated conservatively with over-the-counter NSAIDs and activity modification. Patient currently rates pain in the knee at 9 out of 10 with activity. There is pain at night.  PAST MEDICAL HISTORY: There are no active problems to display for this patient.  Past Medical History  Diagnosis Date  . Hypertension   . Myocardial infarction     15 yrs. ago  . Pneumonia     11/2012  . Arthritis     knees  . Kidney stone   . Fissure, anal 11/2012   Past Surgical History  Procedure Laterality Date  . Cardiac catheterization  05/2012    stent placement  . Joint replacement Right 2/07    6/07 left knee replacement     MEDICATIONS:   No prescriptions prior to admission    ALLERGIES:   Allergies  Allergen Reactions  . Codeine Nausea And Vomiting    REVIEW OF SYSTEMS:  Pertinent items are noted in HPI.   FAMILY HISTORY:  No family history on file.  SOCIAL HISTORY:   History  Substance Use Topics  . Smoking status: Not on file  . Smokeless tobacco: Current User    Types: Chew  . Alcohol Use: Yes     Comment: occasionally     EXAMINATION:  Vital signs in last 24 hours:    General appearance: alert, cooperative and no distress Lungs: clear to auscultation bilaterally Heart: regular rate and rhythm, S1, S2 normal, no murmur, click, rub or gallop Abdomen: soft, non-tender; bowel sounds normal; no masses,  no organomegaly Extremities: extremities normal, atraumatic, no cyanosis or edema and Homans sign is negative, no sign of DVT Pulses: 2+ and symmetric Skin: Skin color, texture, turgor normal. No rashes or  lesions  Musculoskeletal:  ROM 0-90, Ligaments intact,  Imaging Review Plain radiographs demonstrate loosening of components of the left knee. The overall alignment is neutral. The bone quality appears to be good for age and reported activity level.  Assessment/Plan: End stage arthritis, left knee   The patient history, physical examination and imaging studies are consistent with disruption degenerative joint disease of the left knee. The patient has failed conservative treatment.  The clearance notes were reviewed.  After discussion with the patient it was felt that Total Knee Revision was indicated. The procedure,  risks, and benefits of total knee arthroplasty were presented and reviewed. The risks including but not limited to aseptic loosening, infection, blood clots, vascular injury, stiffness, patella tracking problems complications among others were discussed. The patient acknowledged the explanation, agreed to proceed with the plan.  Casidy Alberta 07/07/2013, 7:00 AM

## 2013-07-07 NOTE — Anesthesia Preprocedure Evaluation (Addendum)
Anesthesia Evaluation  Patient identified by MRN, date of birth, ID band Patient awake    Reviewed: Allergy & Precautions, H&P , NPO status , Patient's Chart, lab work & pertinent test results  Airway Mallampati: II TM Distance: >3 FB Neck ROM: Full    Dental  (+) Edentulous Upper and Edentulous Lower   Pulmonary    + decreased breath sounds      Cardiovascular hypertension, Pt. on medications + Past MI Rhythm:Regular Rate:Normal     Neuro/Psych    GI/Hepatic   Endo/Other  Morbid obesity  Renal/GU      Musculoskeletal   Abdominal (+) + obese,   Peds  Hematology   Anesthesia Other Findings   Reproductive/Obstetrics                          Anesthesia Physical Anesthesia Plan  ASA: III  Anesthesia Plan: General   Post-op Pain Management:    Induction: Intravenous  Airway Management Planned: Oral ETT  Additional Equipment:   Intra-op Plan:   Post-operative Plan: Extubation in OR  Informed Consent: I have reviewed the patients History and Physical, chart, labs and discussed the procedure including the risks, benefits and alternatives for the proposed anesthesia with the patient or authorized representative who has indicated his/her understanding and acceptance.     Plan Discussed with: CRNA and Surgeon  Anesthesia Plan Comments:         Anesthesia Quick Evaluation

## 2013-07-07 NOTE — Anesthesia Procedure Notes (Addendum)
Anesthesia Regional Block:  Femoral nerve block  Pre-Anesthetic Checklist: ,, timeout performed, Correct Patient, Correct Site, Correct Laterality, Correct Procedure, Correct Position, site marked, Risks and benefits discussed,  Surgical consent,  Pre-op evaluation,  At surgeon's request and post-op pain management  Laterality: Left  Prep: chloraprep       Needles:  Injection technique: Single-shot  Needle Type: Echogenic Stimulator Needle     Needle Length: 5cm 5 cm Needle Gauge: 22 and 22 G    Additional Needles:  Procedures: nerve stimulator Femoral nerve block  Nerve Stimulator or Paresthesia:  Response: 0.48 mA,   Additional Responses:   Narrative:  Start time: 07/07/2013 9:13 AM End time: 07/07/2013 9:20 AM Injection made incrementally with aspirations every 5 mL. Anesthesiologist: Dr Gypsy Balsam  Additional Notes: 1610-9604 L FNB POP CHG prep, sterile tech stim down to .48ma Multiple neg asp Marc .5% w/epi 1:200000 total 25cc+decadron 4mg  infiltrated No compl Dr Gypsy Balsam  Femoral nerve block Procedure Name: Intubation Date/Time: 07/07/2013 11:07 AM Performed by: Sharlene Dory E Pre-anesthesia Checklist: Patient identified, Emergency Drugs available, Suction available, Patient being monitored and Timeout performed Patient Re-evaluated:Patient Re-evaluated prior to inductionOxygen Delivery Method: Circle system utilized Intubation Type: IV induction Ventilation: Mask ventilation without difficulty and Oral airway inserted - appropriate to patient size Laryngoscope Size: Mac and 4 Grade View: Grade I Tube type: Oral Tube size: 7.5 mm Number of attempts: 1 Airway Equipment and Method: Stylet Placement Confirmation: ETT inserted through vocal cords under direct vision,  positive ETCO2 and breath sounds checked- equal and bilateral Secured at: 22 cm Tube secured with: Tape Dental Injury: Teeth and Oropharynx as per pre-operative assessment

## 2013-07-07 NOTE — Anesthesia Postprocedure Evaluation (Signed)
  Anesthesia Post-op Note  Patient: [name]  Procedure(s) Performed: Procedure(s): TOTAL KNEE REVISION (Left)  Patient Location: PACU  Anesthesia Type:General  Level of Consciousness: awake  Airway and Oxygen Therapy: Patient Spontanous Breathing  Post-op Pain: mild  Post-op Assessment: Post-op Vital signs reviewed  Post-op Vital Signs: Reviewed  Complications: No apparent anesthesia complications

## 2013-07-07 NOTE — Progress Notes (Signed)
Orthopedic Tech Progress Note Patient Details:  [name] [birth year] [id number]  CPM Left Knee CPM Left Knee: On Left Knee Flexion (Degrees): 90 Left Knee Extension (Degrees): 0 Additional Comments: Tapeze bar and foot roll   [name] 07/07/2013, 2:40 PM

## 2013-07-07 NOTE — Preoperative (Addendum)
Beta Blockers   Reason not to administer Beta Blockers:Not Applicable, Atentlol taken at 2200 on 07/06/13

## 2013-07-07 NOTE — Evaluation (Signed)
Physical Therapy Evaluation Patient Details Name: [name] MRN: [id number] DOB: [birth year] Today's Date: 07/07/2013 Time: 7829-5621 PT Time Calculation (min): 28 min  PT Assessment / Plan / Recommendation History of Present Illness  Patient is a 67 yo male s/p Lt TKA revision.  Clinical Impression  Patient presents with problems listed below.  Will benefit from acute PT to maximize independence prior to return home with family.    PT Assessment  Patient needs continued PT services    Follow Up Recommendations  Home health PT;Supervision/Assistance - 24 hour    Does the patient have the potential to tolerate intense rehabilitation      Barriers to Discharge        Equipment Recommendations  None recommended by PT    Recommendations for Other Services     Frequency 7X/week    Precautions / Restrictions Precautions Precautions: Knee Precaution Booklet Issued: Yes (comment) Precaution Comments: Provided patient with education on precautions. Restrictions Weight Bearing Restrictions: Yes LLE Weight Bearing: Weight bearing as tolerated   Pertinent Vitals/Pain       Mobility  Bed Mobility Bed Mobility: Supine to Sit;Sitting - Scoot to Edge of Bed Supine to Sit: 4: Min assist;HOB flat Sitting - Scoot to [place] of Bed: 4: Min guard Details for Bed Mobility Assistance: Verbal cues for technique.  Assist to move LLE off of bed. Transfers Transfers: Sit to Stand;Stand to [employer] Transfers Sit to Stand: 4: Min assist;With upper extremity assist;From bed Stand to Sit: 4: Min assist;With upper extremity assist;With armrests;To chair/3-in-1 Stand Pivot Transfers: 4: Min assist Details for Transfer Assistance: Verbal cues for hand placement and placement of LLE during transitions.  Assist to rise to standing and for balance.  Patient able to take several steps to pivot to chair. Ambulation/Gait Ambulation/Gait Assistance: Not tested (comment)    Exercises Total  Joint Exercises Ankle Circles/Pumps: AROM;Both;10 reps;Seated   PT Diagnosis: Difficulty walking;Acute pain  PT Problem List: Decreased strength;Decreased range of motion;Decreased activity tolerance;Decreased balance;Decreased mobility;Decreased knowledge of use of DME;Decreased knowledge of precautions;Pain PT Treatment Interventions: DME instruction;Gait training;Stair training;Functional mobility training;Therapeutic exercise;Patient/family education     PT Goals(Current goals can be found in the care plan section) Acute Rehab PT Goals Patient Stated Goal: To go home tomorrow PT Goal Formulation: With patient/family Time For Goal Achievement: 07/14/13 Potential to Achieve Goals: Good  Visit Information  Last PT Received On: 07/07/13 Assistance Needed: +1 History of Present Illness: Patient is a 67 yo male s/p Lt TKA revision.       Prior Functioning  Home Living Family/patient expects to be discharged to:: Private residence Living Arrangements: Spouse/significant other Available Help at Discharge: Family;Available 24 hours/day Type of Home: House Home Access: Stairs to enter [employer] of Steps: 1 Entrance Stairs-Rails: None Home Layout: One level Home Equipment: Walker - 2 wheels;Bedside commode;Shower seat Prior Function Level of Independence: Independent Communication Communication: No difficulties    Cognition  Cognition Arousal/Alertness: Awake/alert Behavior During Therapy: WFL for tasks assessed/performed Overall Cognitive Status: Within Functional Limits for tasks assessed    Extremity/Trunk Assessment Upper Extremity Assessment Upper Extremity Assessment: Overall WFL for tasks assessed Lower Extremity Assessment Lower Extremity Assessment: LLE deficits/detail LLE Deficits / Details: Decreased strength and ROM due to surgery. LLE: Unable to fully assess due to pain Cervical / Trunk Assessment Cervical / Trunk Assessment: Normal   Balance     End of Session PT - End of Session Equipment Utilized During Treatment: Gait belt Activity Tolerance: Patient  limited by pain Patient left: in chair;with call bell/phone within reach;with family/visitor present Nurse Communication: Mobility status CPM Left Knee CPM Left Knee: Off (off at 17:15)  GP     Vena Austria 07/07/2013, 5:44 PM Durenda Hurt. Renaldo Fiddler, [facility] Acute Rehab Services Pager [phone]

## 2013-07-07 NOTE — Transfer of Care (Signed)
Immediate Anesthesia Transfer of Care Note  Patient: [name]  Procedure(s) Performed: Procedure(s): TOTAL KNEE REVISION (Left)  Patient Location: PACU  Anesthesia Type:General  Level of Consciousness: awake, alert  and oriented  Airway & Oxygen Therapy: Patient Spontanous Breathing and Patient connected to face mask oxygen  Post-op Assessment: Report given to PACU RN, Post -op Vital signs reviewed and stable and Patient moving all extremities  Post vital signs: Reviewed and stable  Complications: No apparent anesthesia complications

## 2013-07-08 LAB — BASIC METABOLIC PANEL
CO2: 25 mEq/L (ref 19–32)
Calcium: 8.8 mg/dL (ref 8.4–10.5)
GFR calc non Af Amer: 82 mL/min — ABNORMAL LOW (ref >90)
Glucose, Bld: 141 mg/dL — ABNORMAL HIGH (ref 70–99)
Potassium: 4.1 mEq/L (ref 3.5–5.1)
Sodium: 135 mEq/L (ref 135–145)

## 2013-07-08 LAB — CBC
Hemoglobin: 12.9 g/dL — ABNORMAL LOW (ref 13.0–17.0)
MCH: 33.9 pg (ref 26.0–34.0)
MCHC: 35 g/dL (ref 30.0–36.0)
Platelets: 230 10*3/uL (ref 150–400)
RBC: 3.8 MIL/uL — ABNORMAL LOW (ref 4.22–5.81)

## 2013-07-08 MED ORDER — METHOCARBAMOL 500 MG PO TABS: 500.0000 mg | ORAL_TABLET | Freq: Four times a day (QID) | ORAL | Status: DC | PRN

## 2013-07-08 MED ORDER — OXYCODONE HCL ER 20 MG PO T12A: 20.0000 mg | EXTENDED_RELEASE_TABLET | Freq: Two times a day (BID) | ORAL | Status: DC

## 2013-07-08 MED ORDER — OXYCODONE HCL 5 MG PO TABS: 5.0000 mg | ORAL_TABLET | ORAL | Status: DC | PRN

## 2013-07-08 MED ORDER — CELECOXIB 200 MG PO CAPS: 200.0000 mg | ORAL_CAPSULE | Freq: Two times a day (BID) | ORAL | Status: DC

## 2013-07-08 NOTE — Progress Notes (Signed)
07/08/13 Verified with [facility] that patient was set up with HHPT by MD office. Spoke with patient, no change in d/c plan. Rolling walker, 3N1, and CPM provided by [employer].Jacquelynn Cree RN, BSN, CCM

## 2013-07-08 NOTE — Progress Notes (Signed)
Physical Therapy Treatment Patient Details Name: [name] MRN: [id number] DOB: 1946/04/23 Today's Date: 07/08/2013 Time: 7829-5621 PT Time Calculation (min): 24 min  PT Assessment / Plan / Recommendation  History of Present Illness Patient is a 67 yo male s/p Lt TKA revision.   PT Comments   Patient able to tolerate stair training well. Anticipate DC later today.   Follow Up Recommendations  Home health PT;Supervision/Assistance - 24 hour     Does the patient have the potential to tolerate intense rehabilitation     Barriers to Discharge        Equipment Recommendations  None recommended by PT    Recommendations for Other Services    Frequency 7X/week   Progress towards PT Goals Progress towards PT goals: Progressing toward goals  Plan Current plan remains appropriate    Precautions / Restrictions Precautions Precautions: Knee Precaution Booklet Issued: No Precaution Comments: Provided patient with education on precautions. Restrictions Weight Bearing Restrictions: Yes LLE Weight Bearing: Weight bearing as tolerated   Pertinent Vitals/Pain no apparent distress    Mobility  Bed Mobility Bed Mobility: Not assessed Supine to Sit: 6: Modified independent (Device/Increase time) Transfers Sit to Stand: 5: Supervision Stand to Sit: 5: Supervision Details for Transfer Assistance: Min guard for safety; supervision for sit <> stand from 3 in 1 Ambulation/Gait Ambulation/Gait Assistance: 5: Supervision Ambulation Distance (Feet): 250 Feet Assistive device: Rolling walker Gait Pattern: Step-through pattern Stairs: Yes Stairs Assistance: 5: Supervision Stairs Assistance Details (indicate cue type and reason): Patient practiced forward and backward technique with one step  Stair Management Technique: Step to pattern;Backwards;Forwards Number of Stairs: 1    Exercises Total Joint Exercises Quad Sets: AROM;Left;10 reps Heel Slides: AROM;Left;10 reps Hip  ABduction/ADduction: AROM;Left;10 reps Straight Leg Raises: AAROM;Left;10 reps Long Arc Quad: AROM;Left;10 reps   PT Diagnosis:    PT Problem List:   PT Treatment Interventions:     PT Goals (current goals can now be found in the care plan section) Acute Rehab PT Goals Patient Stated Goal: to go home this evening  Visit Information  Last PT Received On: 07/08/13 Assistance Needed: +1 History of Present Illness: Patient is a 67 yo male s/p Lt TKA revision.    Subjective Data  Patient Stated Goal: to go home this evening   Cognition  Cognition Arousal/Alertness: Awake/alert Behavior During Therapy: WFL for tasks assessed/performed Overall Cognitive Status: Within Functional Limits for tasks assessed    Balance     End of Session PT - End of Session Equipment Utilized During Treatment: Gait belt Activity Tolerance: Patient tolerated treatment well Patient left: in chair;with call bell/phone within reach Nurse Communication: Mobility status CPM Left Knee CPM Left Knee: On Left Knee Flexion (Degrees): 90 Left Knee Extension (Degrees): 0   GP     Robinette, Adline Potter 07/08/2013, 1:06 PM 07/08/2013 Fredrich Birks PTA [phone] pager [phone] office

## 2013-07-08 NOTE — Discharge Summary (Signed)
SPORTS MEDICINE & JOINT REPLACEMENT   Georgena Spurling, MD   Altamese Cabal, PA-C [address] [place], [place], [place]  [address]                             [phone]  PATIENT ID: [name]        MRN:  [id number]          DOB/AGE: [birth year] / 69 y.o.    DISCHARGE SUMMARY  ADMISSION DATE:    07/07/2013 DISCHARGE DATE:   07/08/2013   ADMISSION DIAGNOSIS: painful hardware left total knee    DISCHARGE DIAGNOSIS:  painful hardware left total knee    ADDITIONAL DIAGNOSIS: Active Problems:   * No active hospital problems. *  Past Medical History  Diagnosis Date  . Hypertension   . Myocardial infarction     15 yrs. ago  . Pneumonia     11/2012  . Arthritis     knees  . Kidney stone   . Fissure, anal 11/2012    PROCEDURE: Procedure(s): TOTAL KNEE REVISION on 07/07/2013  CONSULTS:     HISTORY:  See H&P in chart  HOSPITAL COURSE:  [name] is a 67 y.o. admitted on 07/07/2013 and found to have a diagnosis of painful hardware left total knee.  After appropriate laboratory studies were obtained  they were taken to the operating room on 07/07/2013 and underwent Procedure(s): TOTAL KNEE REVISION.   They were given perioperative antibiotics:  Anti-infectives   Start     Dose/Rate Route Frequency Ordered Stop   07/07/13 1700  ceFAZolin (ANCEF) IVPB 2 g/50 mL premix     2 g 100 mL/hr over 30 Minutes Intravenous Every 6 hours 07/07/13 1542 07/08/13 0019   07/07/13 0600  ceFAZolin (ANCEF) 3 g in dextrose 5 % 50 mL IVPB     3 g 160 mL/hr over 30 Minutes Intravenous On call to O.R. 07/06/13 1246 07/07/13 1100    .  Tolerated the procedure well.  Placed with a foley intraoperatively.  Given Ofirmev at induction and for 48 hours.    POD# 1: Vital signs were stable.  Patient denied Chest pain, shortness of breath, or calf pain.  Patient was started on Lovenox 30 mg subcutaneously twice daily at 8am.  Consults to PT, OT, and care management were made.  The patient was  weight bearing as tolerated.  CPM was placed on the operative leg 0-90 degrees for 6-8 hours a day.  Incentive spirometry was taught.  Dressing was changed.  Marcaine pump and hemovac were discontinued.      POD #2, Continued  PT for ambulation and exercise program.  IV saline locked.  O2 discontinued.    The remainder of the hospital course was dedicated to ambulation and strengthening.   The patient was discharged on 1 Day Post-Op in  Good condition.  Blood products given:none  DIAGNOSTIC STUDIES: Recent vital signs: Patient Vitals for the past 24 hrs:  BP Temp Temp src Pulse Resp SpO2  07/08/13 0500 115/65 mmHg 98.3 F (36.8 C) Oral 79 18 98 %  07/08/13 0234 141/67 mmHg 98.4 F (36.9 C) Oral 81 18 98 %  07/07/13 2055 143/62 mmHg 97.9 F (36.6 C) Oral 77 18 98 %  07/07/13 1833 132/69 mmHg 98.1 F (36.7 C) - 88 18 97 %       Recent laboratory studies:  Recent Labs  07/08/13 0430  WBC 10.0  HGB 12.9*  HCT 36.9*  PLT 230    Recent Labs  07/08/13 0430  NA 135  K 4.1  CL 98  CO2 25  BUN 17  CREATININE 0.99  GLUCOSE 141*  CALCIUM 8.8   Lab Results  Component Value Date   INR 0.96 06/27/2013     Recent Radiographic Studies :  Dg Chest 2 View  07/07/2013   *RADIOLOGY REPORT*  Clinical Data: Preop for total knee revision  CHEST - 2 VIEW  Comparison: 01/14/2006  Findings: The heart size and vascular pattern are normal.  There is no infiltrate or effusion.  There is a 1 cm nodular opacity projecting over the right lower lobe.  IMPRESSION: Pulmonary nodule versus nipple shadow over the right lower lobe. Would recommend repeating the PA view with nipple markers.   Original Report Authenticated By: Esperanza Heir, M.D.    DISCHARGE INSTRUCTIONS: Discharge Orders   Future Orders Complete By Expires   Call MD / Call 911  As directed    Comments:     If you experience chest pain or shortness of breath, CALL 911 and be transported to the hospital emergency room.  If you  develope a fever above 101 F, pus (white drainage) or increased drainage or redness at the wound, or calf pain, call your surgeon's office.   Change dressing  As directed    Comments:     Change dressing on wednesday, then change the dressing daily with sterile 4 x 4 inch gauze dressing and apply TED hose.   Constipation Prevention  As directed    Comments:     Drink plenty of fluids.  Prune juice may be helpful.  You may use a stool softener, such as Colace (over the counter) 100 mg twice a day.  Use MiraLax (over the counter) for constipation as needed.   CPM  As directed    Comments:     Continuous passive motion machine (CPM):      Use the CPM from 0 to 90 for 6-8 hours per day.      You may increase by 10 per day.  You may break it up into 2 or 3 sessions per day.      Use CPM for 2 weeks or until you are told to stop.   Diet - low sodium heart healthy  As directed    Do not put a pillow under the knee. Place it under the heel.  As directed    Driving restrictions  As directed    Comments:     No driving for 6 weeks   Increase activity slowly as tolerated  As directed    Lifting restrictions  As directed    Comments:     No lifting for 6 weeks   TED hose  As directed    Comments:     Use stockings (TED hose) for 3 weeks on both leg(s).  You may remove them at night for sleeping.      DISCHARGE MEDICATIONS:     Medication List         acetaminophen 650 MG CR tablet  Commonly known as:  TYLENOL  Take 650-1,300 mg by mouth every 8 (eight) hours as needed for pain.     aspirin EC 81 MG tablet  Take 81 mg by mouth daily.     atenolol 25 MG tablet  Commonly known as:  TENORMIN  Take 25 mg by mouth daily.     atorvastatin 40  MG tablet  Commonly known as:  LIPITOR  Take 40 mg by mouth daily.     celecoxib 200 MG capsule  Commonly known as:  CELEBREX  Take 1 capsule (200 mg total) by mouth every 12 (twelve) hours.     clopidogrel 75 MG tablet  Commonly known as:   PLAVIX  Take 75 mg by mouth daily.     enalapril 20 MG tablet  Commonly known as:  VASOTEC  Take 20 mg by mouth daily.     hydrochlorothiazide 25 MG tablet  Commonly known as:  HYDRODIURIL  Take 25 mg by mouth daily.     methocarbamol 500 MG tablet  Commonly known as:  ROBAXIN  Take 1-2 tablets (500-1,000 mg total) by mouth every 6 (six) hours as needed.     nitroGLYCERIN 0.4 MG SL tablet  Commonly known as:  NITROSTAT  Place 0.4 mg under the tongue every 5 (five) minutes as needed for chest pain.     OVER THE COUNTER MEDICATION  Apply 1 application topically daily as needed (for knee). "Perform roll on soln"     oxyCODONE 5 MG immediate release tablet  Commonly known as:  Oxy IR/ROXICODONE  Take 1-2 tablets (5-10 mg total) by mouth every 3 (three) hours as needed.     OxyCODONE 20 mg T12a 12 hr tablet  Commonly known as:  OXYCONTIN  Take 1 tablet (20 mg total) by mouth every 12 (twelve) hours.        FOLLOW UP VISIT:       Follow-up Information   Follow up with Raymon Mutton, MD. Call on 07/22/2013. [facility] [phone] home physical therapy, they will call you)    Specialty:  Orthopedic Surgery   Contact information:   [address] [place] [address] [phone]       DISPOSITION: HOME   CONDITION:  Good   Amika Tassin 07/08/2013, 5:16 PM

## 2013-07-08 NOTE — Progress Notes (Signed)
SPORTS MEDICINE AND JOINT REPLACEMENT  Georgena Spurling, MD   Altamese Cabal, PA-C [address] [place], [place], [place]  [address]                             [phone]   PROGRESS NOTE  Subjective:  negative for Chest Pain  negative for Shortness of Breath  negative for Nausea/Vomiting   negative for Calf Pain  negative for Bowel Movement   Tolerating Diet: yes         Patient reports pain as 3 on 0-10 scale.    Objective: Vital signs in last 24 hours:   Patient Vitals for the past 24 hrs:  BP Temp Temp src Pulse Resp SpO2  07/08/13 0500 115/65 mmHg 98.3 F (36.8 C) Oral 79 18 98 %  07/08/13 0234 141/67 mmHg 98.4 F (36.9 C) Oral 81 18 98 %  07/07/13 2055 143/62 mmHg 97.9 F (36.6 C) Oral 77 18 98 %  07/07/13 1833 132/69 mmHg 98.1 F (36.7 C) - 88 18 97 %    @flow {1959:LAST@   Intake/Output from previous day:   08/18 0701 - 08/19 0700 In: 1700 [P.O.:700; I.V.:1000] Out: 950 [Urine:200; Drains:675]   Intake/Output this shift:   08/19 0701 - 08/19 1900 In: -  Out: 125 [Drains:125]   Intake/Output     08/18 0701 - 08/19 0700 08/19 0701 - 08/20 0700   P.O. 700    I.V. 1000    Total Intake 1700     Urine 200    Drains 675 125   Blood 75    Total Output 950 125   Net +750 -125        Urine Occurrence 3 x       LABORATORY DATA:  Recent Labs  07/08/13 0430  WBC 10.0  HGB 12.9*  HCT 36.9*  PLT 230    Recent Labs  07/08/13 0430  NA 135  K 4.1  CL 98  CO2 25  BUN 17  CREATININE 0.99  GLUCOSE 141*  CALCIUM 8.8   Lab Results  Component Value Date   INR 0.96 06/27/2013    Examination:  General appearance: alert, cooperative and no distress Extremities: Homans sign is negative, no sign of DVT  Wound Exam: clean, dry, intact   Drainage:  None: wound tissue dry  Motor Exam: EHL and FHL Intact  Sensory Exam: Deep Peroneal normal   Assessment:    1 Day Post-Op  Procedure(s) (LRB): TOTAL KNEE REVISION (Left)  ADDITIONAL DIAGNOSIS:   Active Problems:   * No active hospital problems. *  Acute Blood Loss Anemia   Plan: Physical Therapy as ordered Weight Bearing as Tolerated (WBAT)  DVT Prophylaxis:  plavix  DISCHARGE PLAN: Home  DISCHARGE NEEDS: HHPT, CPM, Walker and 3-in-1 comode seat         Shiquita Collignon 07/08/2013, 5:11 PM

## 2013-07-08 NOTE — Evaluation (Addendum)
Occupational Therapy Evaluation Patient Details Name: [name] MRN: [id number] DOB: 08-Mar-1946 Today's Date: 07/08/2013 Time: 4696-2952 OT Time Calculation (min): 25 min  OT Assessment / Plan / Recommendation History of present illness Patient is a 67 yo male s/p Lt TKA revision.   Clinical Impression   Pt did well during session. OT provided education and patient practiced ADLs. Feel pt is safe from OT standpoint to d/c home with wife available to assist 24/7.     OT Assessment  Patient does not need any further OT services    Follow Up Recommendations  No OT follow up;Supervision/Assistance - 24 hour    Barriers to Discharge      Equipment Recommendations  None recommended by OT    Recommendations for Other Services    Frequency       Precautions / Restrictions Precautions Precautions: Knee Precaution Booklet Issued: No Precaution Comments: Provided patient with education on precautions. Restrictions Weight Bearing Restrictions: Yes LLE Weight Bearing: Weight bearing as tolerated   Pertinent Vitals/Pain Pain 3/10 in knee. Increased activity.     ADL  Eating/Feeding: Independent Where Assessed - Eating/Feeding: Chair Grooming: Wash/dry hands;Min guard Where Assessed - Grooming: Unsupported standing Upper Body Bathing: Set up Where Assessed - Upper Body Bathing: Unsupported sitting Lower Body Bathing: Min guard Where Assessed - Lower Body Bathing: Supported sit to stand Upper Body Dressing: Set up Where Assessed - Upper Body Dressing: Unsupported sitting Lower Body Dressing: Min guard Where Assessed - Lower Body Dressing: Supported sit to stand Toilet Transfer: Hydrographic surveyor Method: Other (comment);Sit to stand (urinated standing) Toilet Transfer Equipment: Bedside commode;Comfort height toilet;Other (comment) (urinated standing at comfort height toilet) Toileting - Clothing Manipulation and Hygiene: Min guard Where Assessed - Agricultural consultant Manipulation and Hygiene: Standing Tub/Shower Transfer: Simulated;Min guard Tub/Shower Transfer Method: Science writer: Walk in shower;Other (comment) (3 in 1) Equipment Used: Gait belt;Rolling walker Transfers/Ambulation Related to ADLs: Minguard for ambulation-cues to not have walker too far in front; Minguard/supervision for sit <> stand from 3 in 1 ADL Comments: Pt overall Min guard level for LB ADLs- cues to not twist knee when donning/doffing shoe/sock. Pt practiced simulated shower transfer at [facility] guard level with cues for technique. Educated on dressing technique. Pt donned shorts as well as shirt. Educated to have wife with him 24/7.    OT Diagnosis:    OT Problem List:   OT Treatment Interventions:     OT Goals(Current goals can be found in the care plan section) Acute Rehab OT Goals Patient Stated Goal: to go home this evening  Visit Information  Last OT Received On: 07/08/13 Assistance Needed: +1 History of Present Illness: Patient is a 67 yo male s/p Lt TKA revision.       Prior Functioning     Home Living Family/patient expects to be discharged to:: Private residence Living Arrangements: Spouse/significant other Available Help at Discharge: Family;Available 24 hours/day Type of Home: House Home Access: Stairs to enter [employer] of Steps: 1 Entrance Stairs-Rails: None Home Layout: One level Home Equipment: Walker - 2 wheels;Bedside commode;Shower seat Prior Function Level of Independence: Independent Communication Communication: No difficulties         Vision/Perception     Cognition  Cognition Arousal/Alertness: Awake/alert Behavior During Therapy: WFL for tasks assessed/performed Overall Cognitive Status: Within Functional Limits for tasks assessed    Extremity/Trunk Assessment Upper Extremity Assessment Upper Extremity Assessment: Overall WFL for tasks assessed     Mobility Bed  Mobility Bed  Mobility: Not assessed Supine to Sit: 5: Supervision Transfers Transfers: Sit to Stand;Stand to Sit Sit to Stand: 4: Min guard;With upper extremity assist;From chair/3-in-1;5: Supervision Stand to Sit: 4: Min guard;With upper extremity assist;To chair/3-in-1;5: Supervision Details for Transfer Assistance: Min guard for safety; supervision for sit <> stand from 3 in 1        Balance     End of Session OT - End of Session Equipment Utilized During Treatment: Gait belt;Rolling walker Activity Tolerance: Patient tolerated treatment well Patient left: in chair;with call bell/phone within reach Nurse Communication: Other (comment) (drain came out of knee) CPM Left Knee CPM Left Knee: Off  GO     [name] OTR/L [phone] 07/08/2013, 9:55 AM

## 2013-07-08 NOTE — Op Note (Signed)
Dictation Number: [id number]

## 2013-07-08 NOTE — Progress Notes (Signed)
D/C instructions reviewed with pt and wife. Copy of instructions and scripts given to pt. Pt d/c'd  via wheelchair with wife and belongings, escorted by unit NT.

## 2013-07-08 NOTE — Progress Notes (Signed)
Physical Therapy Treatment Patient Details Name: RASHAD AULD MRN: [id number] DOB: 08-06-1946 Today's Date: 07/08/2013 Time: 0800-0830 PT Time Calculation (min): 30 min  PT Assessment / Plan / Recommendation  History of Present Illness Patient is a 67 yo male s/p Lt TKA revision.   PT Comments   Patient progressing well with ambulation this morning. Will attempt step this afternoon and patient planning to DC later today  Follow Up Recommendations  Home health PT;Supervision/Assistance - 24 hour     Does the patient have the potential to tolerate intense rehabilitation     Barriers to Discharge        Equipment Recommendations  None recommended by PT    Recommendations for Other Services    Frequency 7X/week   Progress towards PT Goals Progress towards PT goals: Progressing toward goals  Plan Current plan remains appropriate    Precautions / Restrictions Precautions Precautions: Knee Precaution Comments: Provided patient with education on precautions. Restrictions LLE Weight Bearing: Weight bearing as tolerated   Pertinent Vitals/Pain no apparent distress     Mobility  Bed Mobility Supine to Sit: 5: Supervision Transfers Sit to Stand: 4: Min guard;With upper extremity assist;From bed Stand to Sit: 4: Min guard;With upper extremity assist;To chair/3-in-1 Details for Transfer Assistance: Verbal cues for hand placement Ambulation/Gait Ambulation/Gait Assistance: 4: Min guard Ambulation Distance (Feet): 200 Feet Assistive device: Rolling walker Ambulation/Gait Assistance Details: Cues for gait sequence, WBAT and safe use of Rw Gait Pattern: Step-to pattern    Exercises Total Joint Exercises Quad Sets: AROM;Left;10 reps Heel Slides: AROM;Left;10 reps Hip ABduction/ADduction: AROM;Left;10 reps Straight Leg Raises: AAROM;Left;10 reps Long Arc Quad: AROM;Left;10 reps   PT Diagnosis:    PT Problem List:   PT Treatment Interventions:     PT Goals (current goals  can now be found in the care plan section)    Visit Information  Last PT Received On: 07/08/13 Assistance Needed: +1 History of Present Illness: Patient is a 67 yo male s/p Lt TKA revision.    Subjective Data      Cognition  Cognition Arousal/Alertness: Awake/alert Behavior During Therapy: WFL for tasks assessed/performed Overall Cognitive Status: Within Functional Limits for tasks assessed    Balance     End of Session PT - End of Session Equipment Utilized During Treatment: Gait belt Activity Tolerance: Patient tolerated treatment well Patient left: in chair;with call bell/phone within reach Nurse Communication: Mobility status   GP     Fredrich Birks 07/08/2013, 9:04 AM 07/08/2013 Fredrich Birks PTA [phone] pager [phone] office

## 2013-07-08 NOTE — Progress Notes (Signed)
UR COMPLETED  

## 2013-07-09 ENCOUNTER — Encounter (HOSPITAL_COMMUNITY): Payer: Self-pay | Admitting: Orthopedic Surgery

## 2013-07-09 NOTE — Op Note (Signed)
NAMEKEYONTAY, STOLZ NO.:  [id number]  MEDICAL RECORD NO.:  [id number]  LOCATION:  5N14C                        FACILITY:  [facility]  PHYSICIAN:  Mila Homer. Sherlean Foot, M.D. DATE OF BIRTH:  August 21, 1946  DATE OF PROCEDURE:  07/07/2013 DATE OF DISCHARGE:                              OPERATIVE REPORT   SURGEON:  Mila Homer. Sherlean Foot, M.D.  ASSISTANT:  Altamese Cabal, PA-C  ANESTHESIA:  General.  PREOPERATIVE DIAGNOSIS:  Left failed total knee replacement.  POSTOPERATIVE DIAGNOSIS:  Left failed total knee replacement.  PROCEDURE:  Left revision total knee arthroplasty.  INDICATION FOR PROCEDURE:  The patient is a 67 year old white male, with a primary total knee done approximately 7 years ago.  He has radiographic evidence __________and informed consent was obtained.  DESCRIPTION OF PROCEDURE:  The patient was laid supine under general anesthesia, left leg prepped and draped in usual sterile fashion.  The extremity was exsanguinated and tourniquet inflated to 350 mmHg. Midline incision made with a #10 blade.  New blade was used to make a median parapatellar arthrotomy.  There was incredible amount of scar tissue which was all debrided and complete synovectomy was performed. Femoral component was obviously grossly loose.  __________flexion and amputated __________the tibial polyethylene, removed the polyethylene and then easily pulled the femur off.  There was fair amount of soft tissue and bony adherence to the femur.  This was debrided.  It still was considered a type A to type B defect in the femur, all were curetted and rongeured back to good bleeding bone.  Nothing needed, bony augmentation __________.  I then used the micro saw to loosen the tibial component, debonded it from the cement easily, removed all the excess cement in the tibia with the knee hyperflexed.  I then reamed sequentially to 15 on the femur and 12 on the tibia.  I then used a sagittal saw  __________ to a size C on the femur and put the box cutter on 15 stem cut for the box.  I then passed 2 distal 5 augments, 1 posterior 5 augment and re-created the femur well.  I then turned my attention to the tibia, I re-cut with actually the external cutting guide, taking the slope out of the previous cut and then trialed with 7 tibial tray, drill keel, and then trial did not need any augmentation there.  I then put in 12 LPS femur and then 14 LPS femur could not get perfect balance, chose to go to a CCK and this was after releasing both some medially and some laterally.  With a G femur, 5 distal augments, 5 posterior lateral augment, 15 stem and with a size 5 tibia with 12 stem and no augments, and 14 CCK insert with excellent stability and excellent motion.  I elected to __________the polyethylene only, patella is tracking well.  I then removed all trial components, copiously irrigated, and cemented it with [place] G cement, the femoral component and tibial component, removed all excess cement, snapped in a __________real CCK 14 polyethylene and let the cement harden in extension.  Copiously irrigated and then closed the arthrotomy with figure-of-eight #1 Vicryl sutures including the __________ #2 Fiber wires at  the VMO.  Then, closed deep soft tissues buried 0 Vicryl, subcuticular 3-0 Vicryl, skin staples.  Dressed with Xeroform, dressing, sponge, sterile Webril, and stocking.  I did infiltrate 60 mg of Exparel in the deep soft tissues, 0.5% Marcaine without epinephrine in the more superficial soft tissues at the very end of the case.  I did leave a Hemovac coming out superolaterally.  EBL:  200 mL.  TOURNIQUET TIME:  82 minutes.          ______________________________ Mila Homer Sherlean Foot, M.D.     SDL/MEDQ  D:  07/08/2013  T:  07/08/2013  Job:  [id number]

## 2013-09-22 ENCOUNTER — Encounter (HOSPITAL_COMMUNITY): Payer: Self-pay

## 2013-09-22 ENCOUNTER — Other Ambulatory Visit: Payer: Self-pay | Admitting: Orthopedic Surgery

## 2013-09-23 ENCOUNTER — Encounter (HOSPITAL_COMMUNITY)
Admission: RE | Admit: 2013-09-23 | Discharge: 2013-09-23 | Disposition: A | Discharge status: Home / Self Care | Payer: MEDICARE | Source: Ambulatory Visit | Attending: Orthopedic Surgery | Admitting: Orthopedic Surgery

## 2013-09-23 ENCOUNTER — Encounter (HOSPITAL_COMMUNITY): Payer: Self-pay

## 2013-09-23 DIAGNOSIS — Z01810 Encounter for preprocedural cardiovascular examination: Secondary | ICD-10-CM | POA: Insufficient documentation

## 2013-09-23 DIAGNOSIS — Z01818 Encounter for other preprocedural examination: Secondary | ICD-10-CM | POA: Insufficient documentation

## 2013-09-23 DIAGNOSIS — Z01812 Encounter for preprocedural laboratory examination: Secondary | ICD-10-CM | POA: Insufficient documentation

## 2013-09-23 LAB — CBC WITH DIFFERENTIAL/PLATELET
Basophils Absolute: 0.1 10*3/uL (ref 0.0–0.1)
Eosinophils Relative: 2 % (ref 0–5)
HCT: 41 % (ref 39.0–52.0)
Lymphocytes Relative: 34 % (ref 12–46)
MCH: 34.1 pg — ABNORMAL HIGH (ref 26.0–34.0)
MCHC: 35.1 g/dL (ref 30.0–36.0)
MCV: 97.2 fL (ref 78.0–100.0)
Neutro Abs: 4.7 10*3/uL (ref 1.7–7.7)
Platelets: 282 10*3/uL (ref 150–400)
RDW: 13.4 % (ref 11.5–15.5)
WBC: 9.1 10*3/uL (ref 4.0–10.5)

## 2013-09-23 LAB — URINALYSIS, ROUTINE W REFLEX MICROSCOPIC
Glucose, UA: NEGATIVE mg/dL
Leukocytes, UA: NEGATIVE
Nitrite: NEGATIVE
Specific Gravity, Urine: 1.025 (ref 1.005–1.030)
Urobilinogen, UA: 0.2 mg/dL (ref 0.0–1.0)
pH: 5 (ref 5.0–8.0)

## 2013-09-23 LAB — COMPREHENSIVE METABOLIC PANEL
ALT: 34 U/L (ref 0–53)
AST: 33 U/L (ref 0–37)
Alkaline Phosphatase: 87 U/L (ref 39–117)
CO2: 23 mEq/L (ref 19–32)
Calcium: 9.8 mg/dL (ref 8.4–10.5)
GFR calc Af Amer: 85 mL/min — ABNORMAL LOW (ref >90)
GFR calc non Af Amer: 73 mL/min — ABNORMAL LOW (ref >90)
Sodium: 139 mEq/L (ref 135–145)

## 2013-09-23 LAB — URINE MICROSCOPIC-ADD ON

## 2013-09-23 LAB — TYPE AND SCREEN

## 2013-09-23 LAB — APTT: aPTT: 28 seconds (ref 24–37)

## 2013-09-23 MED ORDER — CHLORHEXIDINE GLUCONATE 4 % EX LIQD: 60.0000 mL | Freq: Once | CUTANEOUS | Status: DC

## 2013-09-23 NOTE — Pre-Procedure Instructions (Signed)
[name]  09/23/2013   Your procedure is scheduled on:  Monday October 06, 2013 at 0958 AM  Report to [name] Main Entrance "A" at [id number] AM.  Call this number if you have problems the morning of surgery: [phone]   Remember:   Do not eat food or drink liquids after midnight.   Take these medicines the morning of surgery with A SIP OF WATER: None   Do not wear jewelry.  Do not wear lotions, or powders. You may wear deodorant.  Do not shave 48 hours prior to surgery. Men may shave face and neck.  Do not bring valuables to the hospital.  [facility] is not responsible for any belongings or valuables.               Contacts, dentures or bridgework may not be worn into surgery.  Leave suitcase in the car. After surgery it may be brought to your room.  For patients admitted to the hospital, discharge time is determined by your treatment team.               Patients discharged the day of surgery will not be allowed to drive home.    Special Instructions: Incentive Spirometry - Practice and bring it with you on the day of surgery. Shower using CHG 2 nights before surgery and the night before surgery.  If you shower the day of surgery use CHG.  Use special wash - you have one bottle of CHG for all showers.  You should use approximately 1/3 of the bottle for each shower.   Please read over the following fact sheets that you were given: Pain Booklet, Coughing and Deep Breathing, Blood Transfusion Information, MRSA Information and Surgical Site Infection Prevention

## 2013-09-24 LAB — URINE CULTURE
Colony Count: NO GROWTH
Culture: NO GROWTH

## 2013-10-05 MED ORDER — CEFAZOLIN SODIUM-DEXTROSE 2-3 GM-% IV SOLR: 2.0000 g | INTRAVENOUS | Status: DC

## 2013-10-06 ENCOUNTER — Encounter (HOSPITAL_COMMUNITY): Payer: Self-pay | Admitting: *Deleted

## 2013-10-06 ENCOUNTER — Inpatient Hospital Stay (HOSPITAL_COMMUNITY): Payer: MEDICARE | Admitting: Certified Registered"

## 2013-10-06 ENCOUNTER — Encounter (HOSPITAL_COMMUNITY)
Admission: RE | Disposition: A | Discharge status: Home / Self Care | Payer: Self-pay | Source: Ambulatory Visit | Attending: Orthopedic Surgery

## 2013-10-06 ENCOUNTER — Inpatient Hospital Stay (HOSPITAL_COMMUNITY)
Admission: RE | Admit: 2013-10-06 | Discharge: 2013-10-07 | DRG: 467 | Disposition: A | Discharge status: Home / Self Care | Payer: MEDICARE | Source: Ambulatory Visit | Attending: Orthopedic Surgery | Admitting: Orthopedic Surgery

## 2013-10-06 ENCOUNTER — Encounter (HOSPITAL_COMMUNITY): Payer: MEDICARE | Admitting: Certified Registered"

## 2013-10-06 DIAGNOSIS — Y831 Surgical operation with implant of artificial internal device as the cause of abnormal reaction of the patient, or of later complication, without mention of misadventure at the time of the procedure: Secondary | ICD-10-CM | POA: Diagnosis present

## 2013-10-06 DIAGNOSIS — Y92009 Unspecified place in unspecified non-institutional (private) residence as the place of occurrence of the external cause: Secondary | ICD-10-CM

## 2013-10-06 DIAGNOSIS — T8489XA Other specified complication of internal orthopedic prosthetic devices, implants and grafts, initial encounter: Principal | ICD-10-CM | POA: Diagnosis present

## 2013-10-06 DIAGNOSIS — Z96659 Presence of unspecified artificial knee joint: Secondary | ICD-10-CM

## 2013-10-06 DIAGNOSIS — Z96652 Presence of left artificial knee joint: Secondary | ICD-10-CM

## 2013-10-06 DIAGNOSIS — D62 Acute posthemorrhagic anemia: Secondary | ICD-10-CM | POA: Diagnosis not present

## 2013-10-06 DIAGNOSIS — Z87891 Personal history of nicotine dependence: Secondary | ICD-10-CM

## 2013-10-06 DIAGNOSIS — M1710 Unilateral primary osteoarthritis, unspecified knee: Secondary | ICD-10-CM | POA: Diagnosis present

## 2013-10-06 DIAGNOSIS — I252 Old myocardial infarction: Secondary | ICD-10-CM

## 2013-10-06 DIAGNOSIS — I10 Essential (primary) hypertension: Secondary | ICD-10-CM | POA: Diagnosis present

## 2013-10-06 HISTORY — PX: TOTAL KNEE REVISION: SHX996

## 2013-10-06 LAB — CBC
HCT: 35.1 % — ABNORMAL LOW (ref 39.0–52.0)
MCHC: 34.5 g/dL (ref 30.0–36.0)
MCV: 98 fL (ref 78.0–100.0)
Platelets: 248 10*3/uL (ref 150–400)
RDW: 13.7 % (ref 11.5–15.5)
WBC: 12.2 10*3/uL — ABNORMAL HIGH (ref 4.0–10.5)

## 2013-10-06 LAB — CREATININE, SERUM
GFR calc Af Amer: >90 mL/min (ref >90)
GFR calc non Af Amer: 82 mL/min — ABNORMAL LOW (ref >90)

## 2013-10-06 SURGERY — TOTAL KNEE REVISION
Anesthesia: Regional | Site: Knee | Laterality: Right | Wound class: Clean

## 2013-10-06 MED ORDER — BUPIVACAINE LIPOSOME 1.3 % IJ SUSP
20.0000 mL | Freq: Once | INTRAMUSCULAR | Status: DC
  Filled 2013-10-06: qty 20

## 2013-10-06 MED ORDER — CEFAZOLIN SODIUM-DEXTROSE 2-3 GM-% IV SOLR
INTRAVENOUS | Status: AC
  Administered 2013-10-06: 2 g via INTRAVENOUS
  Filled 2013-10-06: qty 50

## 2013-10-06 MED ORDER — ACETAMINOPHEN 650 MG RE SUPP: 650.0000 mg | Freq: Four times a day (QID) | RECTAL | Status: DC | PRN

## 2013-10-06 MED ORDER — BUPIVACAINE-EPINEPHRINE 0.5% -1:200000 IJ SOLN
INTRAMUSCULAR | Status: DC | PRN
  Administered 2013-10-06: 30 mL

## 2013-10-06 MED ORDER — MENTHOL 3 MG MT LOZG: 1.0000 | LOZENGE | OROMUCOSAL | Status: DC | PRN

## 2013-10-06 MED ORDER — ARTIFICIAL TEARS OP OINT
TOPICAL_OINTMENT | OPHTHALMIC | Status: DC | PRN
  Administered 2013-10-06: 1 via OPHTHALMIC

## 2013-10-06 MED ORDER — OXYCODONE HCL 5 MG/5ML PO SOLN
5.0000 mg | Freq: Once | ORAL | Status: AC | PRN
Start: 2013-10-06 — End: 2013-10-06

## 2013-10-06 MED ORDER — LIDOCAINE HCL (CARDIAC) 20 MG/ML IV SOLN
INTRAVENOUS | Status: DC | PRN
  Administered 2013-10-06: 80 mg via INTRAVENOUS

## 2013-10-06 MED ORDER — BUPIVACAINE-EPINEPHRINE PF 0.5-1:200000 % IJ SOLN
INTRAMUSCULAR | Status: DC | PRN
  Administered 2013-10-06: 20 mL

## 2013-10-06 MED ORDER — OXYCODONE HCL 5 MG PO TABS
5.0000 mg | ORAL_TABLET | ORAL | Status: DC | PRN
  Administered 2013-10-07 (×2): 10 mg via ORAL
  Filled 2013-10-06 (×2): qty 2

## 2013-10-06 MED ORDER — LACTATED RINGERS IV SOLN
INTRAVENOUS | Status: DC | PRN
  Administered 2013-10-06 (×3): via INTRAVENOUS

## 2013-10-06 MED ORDER — METHOCARBAMOL 500 MG PO TABS
ORAL_TABLET | ORAL | Status: AC
  Filled 2013-10-06: qty 1

## 2013-10-06 MED ORDER — ACETAMINOPHEN 325 MG PO TABS: 650.0000 mg | ORAL_TABLET | Freq: Four times a day (QID) | ORAL | Status: DC | PRN

## 2013-10-06 MED ORDER — BUPIVACAINE LIPOSOME 1.3 % IJ SUSP
INTRAMUSCULAR | Status: DC | PRN
  Administered 2013-10-06: 20 mL

## 2013-10-06 MED ORDER — BUPIVACAINE-EPINEPHRINE (PF) 0.5% -1:200000 IJ SOLN
INTRAMUSCULAR | Status: AC
  Filled 2013-10-06: qty 10

## 2013-10-06 MED ORDER — PHENOL 1.4 % MT LIQD: 1.0000 | OROMUCOSAL | Status: DC | PRN

## 2013-10-06 MED ORDER — METOCLOPRAMIDE HCL 5 MG/ML IJ SOLN: 5.0000 mg | Freq: Three times a day (TID) | INTRAMUSCULAR | Status: DC | PRN

## 2013-10-06 MED ORDER — DOCUSATE SODIUM 100 MG PO CAPS
100.0000 mg | ORAL_CAPSULE | Freq: Two times a day (BID) | ORAL | Status: DC
  Administered 2013-10-06 – 2013-10-07 (×2): 100 mg via ORAL
  Filled 2013-10-06 (×3): qty 1

## 2013-10-06 MED ORDER — ZOLPIDEM TARTRATE 5 MG PO TABS: 5.0000 mg | ORAL_TABLET | Freq: Every evening | ORAL | Status: DC | PRN

## 2013-10-06 MED ORDER — HYDROMORPHONE HCL PF 1 MG/ML IJ SOLN
INTRAMUSCULAR | Status: AC
  Filled 2013-10-06: qty 1

## 2013-10-06 MED ORDER — PHENYLEPHRINE HCL 10 MG/ML IJ SOLN
INTRAMUSCULAR | Status: DC | PRN
  Administered 2013-10-06 (×3): 40 ug via INTRAVENOUS
  Administered 2013-10-06: 80 ug via INTRAVENOUS
  Administered 2013-10-06: 40 ug via INTRAVENOUS
  Administered 2013-10-06: 80 ug via INTRAVENOUS
  Administered 2013-10-06: 40 ug via INTRAVENOUS

## 2013-10-06 MED ORDER — SODIUM CHLORIDE 0.9 % IR SOLN
Status: DC | PRN
  Administered 2013-10-06 (×2): 1000 mL

## 2013-10-06 MED ORDER — FENTANYL CITRATE 0.05 MG/ML IJ SOLN
INTRAMUSCULAR | Status: DC | PRN
  Administered 2013-10-06: 100 ug via INTRAVENOUS
  Administered 2013-10-06: 50 ug via INTRAVENOUS
  Administered 2013-10-06: 100 ug via INTRAVENOUS

## 2013-10-06 MED ORDER — ATENOLOL 25 MG PO TABS
25.0000 mg | ORAL_TABLET | Freq: Every day | ORAL | Status: DC
  Administered 2013-10-07: 25 mg via ORAL
  Filled 2013-10-06: qty 1

## 2013-10-06 MED ORDER — TRANEXAMIC ACID 100 MG/ML IV SOLN
1000.0000 mg | INTRAVENOUS | Status: AC
  Administered 2013-10-06: 1000 mg via INTRAVENOUS
  Filled 2013-10-06: qty 10

## 2013-10-06 MED ORDER — NITROGLYCERIN 0.4 MG SL SUBL: 0.4000 mg | SUBLINGUAL_TABLET | SUBLINGUAL | Status: DC | PRN

## 2013-10-06 MED ORDER — OXYCODONE HCL 5 MG PO TABS
ORAL_TABLET | ORAL | Status: AC
  Filled 2013-10-06: qty 1

## 2013-10-06 MED ORDER — HYDROCHLOROTHIAZIDE 25 MG PO TABS
12.5000 mg | ORAL_TABLET | Freq: Every day | ORAL | Status: DC
  Filled 2013-10-06: qty 0.5

## 2013-10-06 MED ORDER — ALUM & MAG HYDROXIDE-SIMETH 200-200-20 MG/5ML PO SUSP: 30.0000 mL | ORAL | Status: DC | PRN

## 2013-10-06 MED ORDER — ONDANSETRON HCL 4 MG/2ML IJ SOLN: 4.0000 mg | Freq: Four times a day (QID) | INTRAMUSCULAR | Status: DC | PRN

## 2013-10-06 MED ORDER — MIDAZOLAM HCL 5 MG/ML IJ SOLN: 2.0000 mg | Freq: Once | INTRAMUSCULAR | Status: DC

## 2013-10-06 MED ORDER — FENOFIBRATE 54 MG PO TABS
54.0000 mg | ORAL_TABLET | Freq: Every day | ORAL | Status: DC
  Administered 2013-10-07: 54 mg via ORAL
  Filled 2013-10-06: qty 1

## 2013-10-06 MED ORDER — OXYCODONE HCL ER 10 MG PO T12A
10.0000 mg | EXTENDED_RELEASE_TABLET | Freq: Two times a day (BID) | ORAL | Status: DC
  Administered 2013-10-06: 10 mg via ORAL
  Filled 2013-10-06: qty 1

## 2013-10-06 MED ORDER — CHLORHEXIDINE GLUCONATE 4 % EX LIQD: 60.0000 mL | Freq: Once | CUTANEOUS | Status: DC

## 2013-10-06 MED ORDER — BISACODYL 5 MG PO TBEC: 5.0000 mg | DELAYED_RELEASE_TABLET | Freq: Every day | ORAL | Status: DC | PRN

## 2013-10-06 MED ORDER — METHOCARBAMOL 500 MG PO TABS
500.0000 mg | ORAL_TABLET | Freq: Four times a day (QID) | ORAL | Status: DC | PRN
  Administered 2013-10-06: 500 mg via ORAL
  Filled 2013-10-06: qty 1

## 2013-10-06 MED ORDER — SODIUM CHLORIDE 0.9 % IV SOLN
INTRAVENOUS | Status: DC
  Administered 2013-10-06: 16:00:00 via INTRAVENOUS

## 2013-10-06 MED ORDER — DIPHENHYDRAMINE HCL 12.5 MG/5ML PO ELIX: 12.5000 mg | ORAL_SOLUTION | ORAL | Status: DC | PRN

## 2013-10-06 MED ORDER — EPHEDRINE SULFATE 50 MG/ML IJ SOLN
INTRAMUSCULAR | Status: DC | PRN
  Administered 2013-10-06: 10 mg via INTRAVENOUS
  Administered 2013-10-06: 5 mg via INTRAVENOUS
  Administered 2013-10-06 (×2): 10 mg via INTRAVENOUS

## 2013-10-06 MED ORDER — GLYCOPYRROLATE 0.2 MG/ML IJ SOLN
INTRAMUSCULAR | Status: DC | PRN
  Administered 2013-10-06: 0.6 mg via INTRAVENOUS

## 2013-10-06 MED ORDER — FENTANYL CITRATE 0.05 MG/ML IJ SOLN
50.0000 ug | INTRAMUSCULAR | Status: DC | PRN
  Administered 2013-10-06: 50 ug via INTRAVENOUS

## 2013-10-06 MED ORDER — NEOSTIGMINE METHYLSULFATE 1 MG/ML IJ SOLN
INTRAMUSCULAR | Status: DC | PRN
  Administered 2013-10-06: 4 mg via INTRAVENOUS

## 2013-10-06 MED ORDER — METOCLOPRAMIDE HCL 10 MG PO TABS: 5.0000 mg | ORAL_TABLET | Freq: Three times a day (TID) | ORAL | Status: DC | PRN

## 2013-10-06 MED ORDER — ONDANSETRON HCL 4 MG PO TABS: 4.0000 mg | ORAL_TABLET | Freq: Four times a day (QID) | ORAL | Status: DC | PRN

## 2013-10-06 MED ORDER — SODIUM CHLORIDE 0.9 % IV SOLN: INTRAVENOUS | Status: DC

## 2013-10-06 MED ORDER — CELECOXIB 200 MG PO CAPS
200.0000 mg | ORAL_CAPSULE | Freq: Two times a day (BID) | ORAL | Status: DC
  Administered 2013-10-06 – 2013-10-07 (×2): 200 mg via ORAL
  Filled 2013-10-06 (×3): qty 1

## 2013-10-06 MED ORDER — ROCURONIUM BROMIDE 100 MG/10ML IV SOLN
INTRAVENOUS | Status: DC | PRN
  Administered 2013-10-06: 5 mg via INTRAVENOUS
  Administered 2013-10-06: 50 mg via INTRAVENOUS
  Administered 2013-10-06: 10 mg via INTRAVENOUS
  Administered 2013-10-06: 20 mg via INTRAVENOUS
  Administered 2013-10-06: 10 mg via INTRAVENOUS

## 2013-10-06 MED ORDER — CEFAZOLIN SODIUM-DEXTROSE 2-3 GM-% IV SOLR
2.0000 g | Freq: Four times a day (QID) | INTRAVENOUS | Status: AC
  Administered 2013-10-06 (×2): 2 g via INTRAVENOUS
  Filled 2013-10-06 (×2): qty 50

## 2013-10-06 MED ORDER — METHOCARBAMOL 100 MG/ML IJ SOLN
500.0000 mg | Freq: Four times a day (QID) | INTRAVENOUS | Status: DC | PRN
  Filled 2013-10-06: qty 5

## 2013-10-06 MED ORDER — HYDROMORPHONE HCL PF 1 MG/ML IJ SOLN
0.2500 mg | INTRAMUSCULAR | Status: DC | PRN
  Administered 2013-10-06: 0.5 mg via INTRAVENOUS

## 2013-10-06 MED ORDER — ATORVASTATIN CALCIUM 40 MG PO TABS
40.0000 mg | ORAL_TABLET | Freq: Every day | ORAL | Status: DC
  Administered 2013-10-07: 40 mg via ORAL
  Filled 2013-10-06: qty 1

## 2013-10-06 MED ORDER — ONDANSETRON HCL 4 MG/2ML IJ SOLN
INTRAMUSCULAR | Status: DC | PRN
  Administered 2013-10-06: 4 mg via INTRAVENOUS

## 2013-10-06 MED ORDER — FENTANYL CITRATE 0.05 MG/ML IJ SOLN
INTRAMUSCULAR | Status: AC
  Filled 2013-10-06: qty 2

## 2013-10-06 MED ORDER — ENOXAPARIN SODIUM 30 MG/0.3ML ~~LOC~~ SOLN
30.0000 mg | Freq: Two times a day (BID) | SUBCUTANEOUS | Status: DC
  Administered 2013-10-07: 30 mg via SUBCUTANEOUS
  Filled 2013-10-06 (×3): qty 0.3

## 2013-10-06 MED ORDER — HYDROMORPHONE HCL PF 1 MG/ML IJ SOLN
1.0000 mg | INTRAMUSCULAR | Status: DC | PRN
Start: 2013-10-06 — End: 2013-10-07
  Administered 2013-10-06: 1 mg via INTRAVENOUS
  Filled 2013-10-06: qty 1

## 2013-10-06 MED ORDER — LACTATED RINGERS IV SOLN
INTRAVENOUS | Status: DC
  Administered 2013-10-06: 09:00:00 via INTRAVENOUS

## 2013-10-06 MED ORDER — MIDAZOLAM HCL 2 MG/2ML IJ SOLN
1.0000 mg | INTRAMUSCULAR | Status: DC | PRN
  Administered 2013-10-06: 1 mg via INTRAVENOUS

## 2013-10-06 MED ORDER — ENALAPRIL MALEATE 20 MG PO TABS
20.0000 mg | ORAL_TABLET | Freq: Every day | ORAL | Status: DC
  Administered 2013-10-07: 20 mg via ORAL
  Filled 2013-10-06: qty 1

## 2013-10-06 MED ORDER — FENTANYL CITRATE 0.05 MG/ML IJ SOLN: 100.0000 ug | Freq: Once | INTRAMUSCULAR | Status: DC

## 2013-10-06 MED ORDER — FLEET ENEMA 7-19 GM/118ML RE ENEM: 1.0000 | ENEMA | Freq: Once | RECTAL | Status: AC | PRN

## 2013-10-06 MED ORDER — PROPOFOL 10 MG/ML IV BOLUS
INTRAVENOUS | Status: DC | PRN
  Administered 2013-10-06: 160 mg via INTRAVENOUS

## 2013-10-06 MED ORDER — MIDAZOLAM HCL 2 MG/2ML IJ SOLN
INTRAMUSCULAR | Status: AC
  Filled 2013-10-06: qty 2

## 2013-10-06 MED ORDER — SENNOSIDES-DOCUSATE SODIUM 8.6-50 MG PO TABS: 1.0000 | ORAL_TABLET | Freq: Every evening | ORAL | Status: DC | PRN

## 2013-10-06 MED ORDER — OXYCODONE HCL 5 MG PO TABS
5.0000 mg | ORAL_TABLET | Freq: Once | ORAL | Status: AC | PRN
  Administered 2013-10-06: 5 mg via ORAL

## 2013-10-06 SURGICAL SUPPLY — 88 items
AUGMENT BLOCK POST SZ G 5MM (Orthopedic Implant) ×1 IMPLANT
BANDAGE ELASTIC 6 VELCRO ST LF (GAUZE/BANDAGES/DRESSINGS) ×1 IMPLANT
BANDAGE ESMARK 6X9 LF (GAUZE/BANDAGES/DRESSINGS) ×1 IMPLANT
BLADE SAGITTAL 13X1.27X60 (BLADE) ×2 IMPLANT
BLADE SAW SGTL 13X75X1.27 (BLADE) ×1 IMPLANT
BLADE SAW SGTL 83.5X18.5 (BLADE) ×2 IMPLANT
BLADE SAW SGTL NAR THIN XSHT (BLADE) ×2 IMPLANT
BLADE SURG 10 STRL SS (BLADE) ×3 IMPLANT
BNDG CMPR 9X6 STRL LF SNTH (GAUZE/BANDAGES/DRESSINGS) ×1
BNDG ESMARK 6X9 LF (GAUZE/BANDAGES/DRESSINGS) ×2
BONE CEMENT PALACOSE (Orthopedic Implant) ×4 IMPLANT
BOWL SMART MIX CTS (DISPOSABLE) ×1 IMPLANT
CATH KIT ON Q 5IN SLV (PAIN MANAGEMENT) ×1 IMPLANT
CEMENT BONE PALACOSE (Orthopedic Implant) ×2 IMPLANT
CLOTH BEACON ORANGE TIMEOUT ST (SAFETY) ×1 IMPLANT
COVER SURGICAL LIGHT HANDLE (MISCELLANEOUS) ×2 IMPLANT
CUFF TOURNIQUET SINGLE 34IN LL (TOURNIQUET CUFF) ×1 IMPLANT
DRAPE EXTREMITY T 121X128X90 (DRAPE) ×2 IMPLANT
DRAPE INCISE IOBAN 66X45 STRL (DRAPES) ×4 IMPLANT
DRAPE PROXIMA HALF (DRAPES) ×2 IMPLANT
DRAPE U-SHAPE 47X51 STRL (DRAPES) ×2 IMPLANT
DRSG ADAPTIC 3X8 NADH LF (GAUZE/BANDAGES/DRESSINGS) ×2 IMPLANT
DRSG PAD ABDOMINAL 8X10 ST (GAUZE/BANDAGES/DRESSINGS) ×2 IMPLANT
DURAPREP 26ML APPLICATOR (WOUND CARE) ×3 IMPLANT
ELECT REM PT RETURN 9FT ADLT (ELECTROSURGICAL) ×2
ELECTRODE REM PT RTRN 9FT ADLT (ELECTROSURGICAL) ×1 IMPLANT
EVACUATOR 1/8 PVC DRAIN (DRAIN) ×2 IMPLANT
FACESHIELD LNG OPTICON STERILE (SAFETY) ×2 IMPLANT
FEMORAL COMPONENT SZ G RT (Orthopedic Implant) ×1 IMPLANT
FEMORAL DISTAL AUGNEMT BLOCK (Orthopedic Implant) ×2 IMPLANT
GLOVE BIO SURGEON STRL SZ 6.5 (GLOVE) ×3 IMPLANT
GLOVE BIO SURGEON STRL SZ8.5 (GLOVE) ×2 IMPLANT
GLOVE BIOGEL M 7.0 STRL (GLOVE) ×1 IMPLANT
GLOVE BIOGEL PI IND STRL 6.5 (GLOVE) IMPLANT
GLOVE BIOGEL PI IND STRL 7.5 (GLOVE) IMPLANT
GLOVE BIOGEL PI IND STRL 8.5 (GLOVE) ×1 IMPLANT
GLOVE BIOGEL PI INDICATOR 6.5 (GLOVE) ×2
GLOVE BIOGEL PI INDICATOR 7.5 (GLOVE)
GLOVE BIOGEL PI INDICATOR 8.5 (GLOVE) ×2
GLOVE BIOGEL PI ORTHO PRO SZ8 (GLOVE)
GLOVE PI ORTHO PRO STRL SZ8 (GLOVE) ×1 IMPLANT
GLOVE SURG ORTHO 8.0 STRL STRW (GLOVE) ×5 IMPLANT
GLOVE SURG SS PI 8.5 STRL IVOR (GLOVE) ×2
GLOVE SURG SS PI 8.5 STRL STRW (GLOVE) IMPLANT
GOWN PREVENTION PLUS XLARGE (GOWN DISPOSABLE) ×4 IMPLANT
GOWN SRG XL XLNG 56XLVL 4 (GOWN DISPOSABLE) IMPLANT
GOWN STRL NON-REIN LRG LVL3 (GOWN DISPOSABLE) ×2 IMPLANT
GOWN STRL NON-REIN XL XLG LVL4 (GOWN DISPOSABLE) ×4
GOWN STRL REIN 2XL XLG LVL4 (GOWN DISPOSABLE) ×2 IMPLANT
HANDPIECE INTERPULSE COAX TIP (DISPOSABLE) ×2
HOOD PEEL AWAY FACE SHEILD DIS (HOOD) ×10 IMPLANT
KIT BASIN OR (CUSTOM PROCEDURE TRAY) ×2 IMPLANT
KIT ROOM TURNOVER OR (KITS) ×2 IMPLANT
KNEE CONSTRAINED CONDYLAR 20M (Knees) ×1 IMPLANT
MANIFOLD NEPTUNE II (INSTRUMENTS) ×2 IMPLANT
NDL 18GX1X1/2 (RX/OR ONLY) (NEEDLE) IMPLANT
NEEDLE 18GX1X1/2 (RX/OR ONLY) (NEEDLE) IMPLANT
NEEDLE 22X1 1/2 (OR ONLY) (NEEDLE) ×2 IMPLANT
NS IRRIG 1000ML POUR BTL (IV SOLUTION) ×2 IMPLANT
PACK TOTAL JOINT (CUSTOM PROCEDURE TRAY) ×2 IMPLANT
PAD ARMBOARD 7.5X6 YLW CONV (MISCELLANEOUS) ×3 IMPLANT
PADDING CAST COTTON 6X4 STRL (CAST SUPPLIES) ×2 IMPLANT
PATELLA LRG REPLACEMENT (Knees) ×1 IMPLANT
PLATE TIB 5 74X46NT ZIER (Orthopedic Implant) IMPLANT
SET HNDPC FAN SPRY TIP SCT (DISPOSABLE) IMPLANT
SLEEVE SURGEON STRL (DRAPES) ×1 IMPLANT
SPONGE GAUZE 4X4 12PLY (GAUZE/BANDAGES/DRESSINGS) ×2 IMPLANT
STAPLER VISISTAT 35W (STAPLE) ×2 IMPLANT
STEM ST EXT ZIER 100X145X12X (Stem) IMPLANT
STEM ST EXT ZIER 100X145X15X (Stem) IMPLANT
STEM ST EXT ZIMMER (Stem) ×4 IMPLANT
SUCTION FRAZIER TIP 10 FR DISP (SUCTIONS) ×2 IMPLANT
SUT BONE WAX W31G (SUTURE) ×2 IMPLANT
SUT PDS AB 0 CT 36 (SUTURE) IMPLANT
SUT PDS AB 1 CT  36 (SUTURE)
SUT PDS AB 1 CT 36 (SUTURE) IMPLANT
SUT PDS AB 2-0 CT1 27 (SUTURE) IMPLANT
SUT VIC AB 0 CTB1 27 (SUTURE) ×2 IMPLANT
SUT VIC AB 1 CT1 27 (SUTURE) ×6
SUT VIC AB 1 CT1 27XBRD ANBCTR (SUTURE) IMPLANT
SUT VIC AB 2-0 CTB1 (SUTURE) ×2 IMPLANT
SYR 20ML ECCENTRIC (SYRINGE) IMPLANT
TIBIA ZIMMER (Orthopedic Implant) ×2 IMPLANT
TOWEL OR 17X24 6PK STRL BLUE (TOWEL DISPOSABLE) ×2 IMPLANT
TOWEL OR 17X26 10 PK STRL BLUE (TOWEL DISPOSABLE) ×2 IMPLANT
TRAY FOLEY CATH 16FRSI W/METER (SET/KITS/TRAYS/PACK) ×1 IMPLANT
TUBE ANAEROBIC SPECIMEN COL (MISCELLANEOUS) IMPLANT
WATER STERILE IRR 1000ML POUR (IV SOLUTION) ×4 IMPLANT

## 2013-10-06 NOTE — Evaluation (Signed)
Physical Therapy Evaluation Patient Details Name: [name] MRN: [id number] DOB: 1946-10-07 Today's Date: 10/06/2013 Time: 4098-1191 PT Time Calculation (min): 18 min  PT Assessment / Plan / Recommendation History of Present Illness  Patient is a 67 yo male s/p Rt TKA revision.  Patient has had 2 joint replacements on each knee.  Clinical Impression  Patient presents with problems listed below.  Will benefit from acute PT to maximize independence prior to discharge home with wife.  Patient has had 3 prior TKA's - should progress well with PT.    PT Assessment  Patient needs continued PT services    Follow Up Recommendations  Home health PT;Supervision/Assistance - 24 hour    Does the patient have the potential to tolerate intense rehabilitation      Barriers to Discharge        Equipment Recommendations  None recommended by PT    Recommendations for Other Services     Frequency 7X/week    Precautions / Restrictions Precautions Precautions: Knee Precaution Booklet Issued: Yes (comment) Precaution Comments: Reviewed precautions with patient. Restrictions Weight Bearing Restrictions: Yes RLE Weight Bearing: Weight bearing as tolerated   Pertinent Vitals/Pain       Mobility  Bed Mobility Bed Mobility: Not assessed (Patient on BSC as PT entered room) Transfers Transfers: Sit to Stand;Stand to Sit;Stand Pivot Transfers Sit to Stand: 4: Min assist;With upper extremity assist;With armrests;From chair/3-in-1 Stand to Sit: 4: Min assist;With upper extremity assist;With armrests;To chair/3-in-1 Stand Pivot Transfers: 4: Min assist Details for Transfer Assistance: Verbal cues for hand placement and safe use of RW.  Patient able to move to standing, with assist for balance.  Patient able to take several steps to pivot to chair. Ambulation/Gait Ambulation/Gait Assistance: Not tested (comment)    Exercises Total Joint Exercises Ankle Circles/Pumps: AROM;Both;10  reps;Seated   PT Diagnosis: Difficulty walking;Acute pain  PT Problem List: Decreased strength;Decreased range of motion;Decreased activity tolerance;Decreased mobility;Decreased knowledge of use of DME;Decreased knowledge of precautions;Pain PT Treatment Interventions: DME instruction;Gait training;Stair training;Functional mobility training;Therapeutic exercise;Patient/family education     PT Goals(Current goals can be found in the care plan section) Acute Rehab PT Goals Patient Stated Goal: To go home tomorrow PT Goal Formulation: With patient/family Time For Goal Achievement: 10/13/13 Potential to Achieve Goals: Good  Visit Information  Last PT Received On: 10/06/13 Assistance Needed: +1 History of Present Illness: Patient is a 67 yo male s/p Rt TKA revision.  Patient has had 2 joint replacements on each knee.       Prior Functioning  Home Living Family/patient expects to be discharged to:: Private residence Living Arrangements: Spouse/significant other Available Help at Discharge: Family;Available 24 hours/day Type of Home: House Home Access: Stairs to enter [employer] of Steps: 1 Entrance Stairs-Rails: None Home Layout: One level Home Equipment: Walker - 2 wheels;Bedside commode;Shower seat Prior Function Level of Independence: Independent Communication Communication: No difficulties    Cognition  Cognition Arousal/Alertness: Awake/alert Behavior During Therapy: WFL for tasks assessed/performed Overall Cognitive Status: Within Functional Limits for tasks assessed    Extremity/Trunk Assessment Upper Extremity Assessment Upper Extremity Assessment: Overall WFL for tasks assessed Lower Extremity Assessment Lower Extremity Assessment: RLE deficits/detail RLE Deficits / Details: Decreased strength and ROM due to surgery/pain.  Able to lift RLE against gravity. Cervical / Trunk Assessment Cervical / Trunk Assessment: Normal   Balance    End of Session PT  - End of Session Equipment Utilized During Treatment: Gait belt Activity Tolerance: Patient tolerated treatment well  Patient left: in chair;with call bell/phone within reach;with family/visitor present Nurse Communication: Mobility status CPM Right Knee CPM Right Knee: Off  GP     Vena Austria 10/06/2013, 6:08 PM Durenda Hurt. Renaldo Fiddler, [facility] Acute Rehab Services Pager [phone]

## 2013-10-06 NOTE — Progress Notes (Signed)
Orthopedic Tech Progress Note Patient Details:  [name] [birth year] [id number]  CPM Right Knee CPM Right Knee: On Right Knee Flexion (Degrees): 90 Right Knee Extension (Degrees): 0 Additional Comments: trapeze bar patient helper Viewed order from doctor's order list  [name] 10/06/2013, 2:23 PM

## 2013-10-06 NOTE — Anesthesia Procedure Notes (Addendum)
Anesthesia Regional Block:  Adductor canal block  Pre-Anesthetic Checklist: ,, timeout performed, Correct Patient, Correct Site, Correct Laterality, Correct Procedure, Correct Position, site marked, Risks and benefits discussed,  Surgical consent,  Pre-op evaluation,  At surgeon's request and post-op pain management  Laterality: Right  Prep: chloraprep       Needles:  Injection technique: Single-shot  Needle Type: Echogenic Needle      Needle Gauge: 21 and 21 G    Additional Needles:  Procedures: ultrasound guided (picture in chart) Adductor canal block Narrative:  Start time: 10/06/2013 9:55 AM End time: 10/06/2013 10:07 AM Injection made incrementally with aspirations every 5 mL.  Performed by: Personally  Anesthesiologist: Dr Chaney Malling  Adductor canal block Procedure Name: Intubation Date/Time: 10/06/2013 10:52 AM Performed by: Lanell Matar Pre-anesthesia Checklist: Patient identified, Timeout performed, Emergency Drugs available, Suction available and Patient being monitored Patient Re-evaluated:Patient Re-evaluated prior to inductionOxygen Delivery Method: Circle system utilized Preoxygenation: Pre-oxygenation with 100% oxygen Intubation Type: IV induction Ventilation: Mask ventilation without difficulty and Oral airway inserted - appropriate to patient size Laryngoscope Size: Hyacinth Meeker and 2 Grade View: Grade I Tube type: Oral Tube size: 7.5 mm Number of attempts: 1 Airway Equipment and Method: Stylet Placement Confirmation: ETT inserted through vocal cords under direct vision,  positive ETCO2,  CO2 detector and breath sounds checked- equal and bilateral Secured at: 22 cm Tube secured with: Tape Dental Injury: Teeth and Oropharynx as per pre-operative assessment

## 2013-10-06 NOTE — Transfer of Care (Signed)
Immediate Anesthesia Transfer of Care Note  Patient: [name]  Procedure(s) Performed: Procedure(s): TOTAL KNEE REVISION (Right)  Patient Location: PACU  Anesthesia Type:General  Level of Consciousness: awake, alert  and oriented  Airway & Oxygen Therapy: Patient Spontanous Breathing and Patient connected to nasal cannula oxygen  Post-op Assessment: Report given to PACU RN and Post -op Vital signs reviewed and stable  Post vital signs: Reviewed and stable  Complications: No apparent anesthesia complications

## 2013-10-06 NOTE — H&P (Signed)
  [name] MRN:  [id number] DOB/SEX:  [birth year]/male  CHIEF COMPLAINT:  Painful right Knee  HISTORY: Patient is a 67 y.o. male presented with a history of pain in the right knee. Onset of symptoms was abrupt starting several months ago with gradually worsening course since that time. Prior procedures on the knee include arthroplasty. Patient has been treated conservatively with over-the-counter NSAIDs and activity modification. Patient currently rates pain in the knee at 9 out of 10 with activity. There is pain at night.  PAST MEDICAL HISTORY: There are no active problems to display for this patient.  Past Medical History  Diagnosis Date  . Hypertension   . Myocardial infarction     15 yrs. ago  . Pneumonia     11/2012  . Arthritis     knees  . Kidney stone   . Fissure, anal 11/2012   Past Surgical History  Procedure Laterality Date  . Cardiac catheterization  05/2012    stent placement  . Joint replacement Right 2/07    6/07 left knee replacement  . 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 Tonsillectomy    . Colonoscopy  2009     MEDICATIONS:   No prescriptions prior to admission    ALLERGIES:   Allergies  Allergen Reactions  . Codeine Nausea And Vomiting    REVIEW OF SYSTEMS:  Pertinent items are noted in HPI.   FAMILY HISTORY:  No family history on file.  SOCIAL HISTORY:   History  Substance Use Topics  . Smoking status: Former Smoker -- 1.00 packs/day for 16 years    Types: Cigarettes    Quit date: 07/07/1973  . Smokeless tobacco: Current User    Types: Chew  . Alcohol Use: Yes     Comment: occasionally     EXAMINATION:  Vital signs in last 24 hours:    General appearance: alert, cooperative and no distress Lungs: clear to auscultation bilaterally Heart: regular rate and rhythm, S1, S2 normal, no murmur, click, rub or  gallop Abdomen: soft, non-tender; bowel sounds normal; no masses,  no organomegaly Extremities: extremities normal, atraumatic, no cyanosis or edema and Homans sign is negative, no sign of DVT Pulses: 2+ and symmetric Skin: Skin color, texture, turgor normal. No rashes or lesions Neurologic: Alert and oriented X 3, normal strength and tone. Normal symmetric reflexes. Normal coordination and gait  Musculoskeletal:  ROM 0-90, Ligaments intact,  Imaging Review Plain radiographs demonstrate loosening of the right knee componets. The overall alignment is neutral. The bone quality appears to be good for age and reported activity level.  Assessment/Plan: Right painful knee after replacement  The patient history, physical examination and imaging studies are consistent with progressive wear of the right knee. The patient has failed conservative treatment.  The clearance notes were reviewed.  After discussion with the patient it was felt that Total Knee Revision indicated. The procedure,  risks, and benefits of total knee arthroplasty were presented and reviewed. The risks including but not limited to aseptic loosening, infection, blood clots, vascular injury, stiffness, patella tracking problems complications among others were discussed. The patient acknowledged the explanation, agreed to proceed with the plan.  [name] 10/06/2013, 6:58 AM

## 2013-10-06 NOTE — Anesthesia Postprocedure Evaluation (Signed)
Anesthesia Post Note  Patient: [name]  Procedure(s) Performed: Procedure(s) (LRB): TOTAL KNEE REVISION (Right)  Anesthesia type: General  Patient location: PACU  Post pain: Pain level controlled and Adequate analgesia  Post assessment: Post-op Vital signs reviewed, Patient's Cardiovascular Status Stable, Respiratory Function Stable, Patent Airway and Pain level controlled  Last Vitals:  Filed Vitals:   10/06/13 1415  BP:   Pulse: 66  Temp:   Resp: 18    Post vital signs: Reviewed and stable  Level of consciousness: awake, alert  and oriented  Complications: No apparent anesthesia complications

## 2013-10-06 NOTE — Anesthesia Preprocedure Evaluation (Addendum)
Anesthesia Evaluation  Patient identified by MRN, date of birth, ID band Patient awake    Reviewed: Allergy & Precautions, H&P , NPO status , Patient's Chart, lab work & pertinent test results  Airway Mallampati: II TM Distance: >3 FB Neck ROM: full    Dental  (+) Edentulous Upper, Edentulous Lower and Dental Advisory Given   Pulmonary former smoker,          Cardiovascular hypertension, + Past MI     Neuro/Psych    GI/Hepatic   Endo/Other  Morbid obesity  Renal/GU      Musculoskeletal  (+) Arthritis -,   Abdominal   Peds  Hematology   Anesthesia Other Findings   Reproductive/Obstetrics                          Anesthesia Physical Anesthesia Plan  ASA: III  Anesthesia Plan: General and Regional   Post-op Pain Management: MAC Combined w/ Regional for Post-op pain   Induction: Intravenous  Airway Management Planned: Oral ETT  Additional Equipment:   Intra-op Plan:   Post-operative Plan: Extubation in OR  Informed Consent: I have reviewed the patients History and Physical, chart, labs and discussed the procedure including the risks, benefits and alternatives for the proposed anesthesia with the patient or authorized representative who has indicated his/her understanding and acceptance.     Plan Discussed with: CRNA, Anesthesiologist and Surgeon  Anesthesia Plan Comments:         Anesthesia Quick Evaluation

## 2013-10-07 LAB — CBC
HCT: 33 % — ABNORMAL LOW (ref 39.0–52.0)
Hemoglobin: 11.4 g/dL — ABNORMAL LOW (ref 13.0–17.0)
MCH: 33.8 pg (ref 26.0–34.0)
MCHC: 34.5 g/dL (ref 30.0–36.0)
MCV: 97.9 fL (ref 78.0–100.0)
Platelets: 235 10*3/uL (ref 150–400)
RDW: 13.8 % (ref 11.5–15.5)

## 2013-10-07 LAB — BASIC METABOLIC PANEL
CO2: 25 mEq/L (ref 19–32)
Chloride: 97 mEq/L (ref 96–112)
Creatinine, Ser: 1.08 mg/dL (ref 0.50–1.35)
Sodium: 135 mEq/L (ref 135–145)

## 2013-10-07 MED ORDER — HYDROCHLOROTHIAZIDE 12.5 MG PO CAPS
12.5000 mg | ORAL_CAPSULE | Freq: Every day | ORAL | Status: DC
  Administered 2013-10-07: 12.5 mg via ORAL
  Filled 2013-10-07: qty 1

## 2013-10-07 MED ORDER — METHOCARBAMOL 500 MG PO TABS: 500.0000 mg | ORAL_TABLET | Freq: Four times a day (QID) | ORAL | Status: AC | PRN

## 2013-10-07 MED ORDER — OXYCODONE HCL ER 10 MG PO T12A: 10.0000 mg | EXTENDED_RELEASE_TABLET | Freq: Two times a day (BID) | ORAL | Status: AC

## 2013-10-07 MED ORDER — OXYCODONE HCL 5 MG PO TABS: 5.0000 mg | ORAL_TABLET | ORAL | Status: AC | PRN

## 2013-10-07 MED ORDER — ENOXAPARIN SODIUM 40 MG/0.4ML ~~LOC~~ SOLN: 40.0000 mg | SUBCUTANEOUS | Status: AC

## 2013-10-07 NOTE — Progress Notes (Signed)
SPORTS MEDICINE AND JOINT REPLACEMENT  Georgena Spurling, MD   Altamese Cabal, PA-C [address] [place], [place], [place]  [address]                             [phone]   PROGRESS NOTE  Subjective:  negative for Chest Pain  negative for Shortness of Breath  negative for Nausea/Vomiting   negative for Calf Pain  negative for Bowel Movement   Tolerating Diet: yes         Patient reports pain as 3 on 0-10 scale.    Objective: Vital signs in last 24 hours:   Patient Vitals for the past 24 hrs:  BP Temp Temp src Pulse Resp SpO2  10/07/13 0530 121/68 mmHg 98 F (36.7 C) Oral 67 18 99 %  10/07/13 0100 116/71 mmHg 98.5 F (36.9 C) Oral 85 18 99 %  10/06/13 2040 106/61 mmHg 98.3 F (36.8 C) - 70 18 99 %  10/06/13 1600 - - - - 18 98 %  10/06/13 1515 129/69 mmHg 98 F (36.7 C) - 74 18 100 %  10/06/13 1500 - 97.8 F (36.6 C) - 64 22 100 %  10/06/13 1454 113/71 mmHg - - 67 18 94 %  10/06/13 1445 113/71 mmHg - - 65 17 96 %  10/06/13 1439 125/69 mmHg - - 68 14 96 %  10/06/13 1430 125/69 mmHg - - 64 15 97 %  10/06/13 1424 131/67 mmHg - - 65 14 99 %  10/06/13 1415 131/67 mmHg - - 66 18 100 %  10/06/13 1409 116/78 mmHg - - 63 14 99 %  10/06/13 1400 116/78 mmHg - - 70 19 97 %  10/06/13 1354 117/86 mmHg 97.9 F (36.6 C) - - 17 98 %  10/06/13 0954 136/78 mmHg - - 78 14 95 %  10/06/13 0953 - - - 88 16 96 %  10/06/13 0952 - - - 83 22 96 %  10/06/13 0951 - - - 83 22 100 %  10/06/13 0950 - - - 73 23 100 %  10/06/13 0948 128/72 mmHg - - 78 12 98 %  10/06/13 0947 - - - 77 19 99 %  10/06/13 0946 - - - 80 22 99 %  10/06/13 0945 - - - 72 26 99 %  10/06/13 0944 106/79 mmHg - - 82 18 97 %  10/06/13 0943 - - - 73 14 98 %  10/06/13 0942 - - - 78 28 99 %  10/06/13 0941 - - - 78 17 98 %  10/06/13 0940 - - - 74 17 99 %  10/06/13 0939 105/69 mmHg - - 77 20 98 %  10/06/13 0938 - - - 74 21 98 %  10/06/13 0937 - - - 87 18 99 %  10/06/13 0936 - - - 72 20 98 %  10/06/13 0935 - - - 78 17 99 %   10/06/13 0934 119/54 mmHg - - 79 19 98 %  10/06/13 0933 - - - 79 13 99 %  10/06/13 0932 - - - 72 14 99 %  10/06/13 0931 - - - 76 28 99 %  10/06/13 0930 116/71 mmHg - - 76 17 98 %  10/06/13 0929 - - - 73 17 99 %  10/06/13 0928 - - - 69 22 99 %  10/06/13 0927 - - - 74 17 99 %  10/06/13 0926 - - - 73 18 99 %  10/06/13 0925 118/69 mmHg - - 71 22 98 %  10/06/13 0924 - - - 76 13 98 %  10/06/13 0923 - - - 76 20 99 %  10/06/13 0922 - - - 74 15 99 %  10/06/13 0921 - - - 73 15 100 %  10/06/13 0920 115/59 mmHg - - 74 20 99 %  10/06/13 0919 - - - 70 15 98 %  10/06/13 0918 - - - 70 25 99 %  10/06/13 0917 - - - 69 19 99 %  10/06/13 0916 - - - 71 13 99 %  10/06/13 0915 119/58 mmHg - - 72 11 99 %  10/06/13 0914 - - - 74 16 99 %  10/06/13 0913 - - - 72 13 100 %  10/06/13 0910 118/67 mmHg - - 71 20 97 %  10/06/13 0905 - - - 73 19 98 %  10/06/13 0904 115/65 mmHg - - 72 15 97 %  10/06/13 0900 - - - 72 14 98 %  10/06/13 0859 125/63 mmHg - - 75 21 98 %  10/06/13 0858 - - - 73 13 97 %  10/06/13 0857 - - - 76 15 99 %  10/06/13 0856 - - - 72 17 98 %  10/06/13 0855 - - - 72 15 98 %  10/06/13 0854 113/59 mmHg - - 73 32 95 %  10/06/13 0817 145/78 mmHg 97.3 F (36.3 C) Oral 76 20 99 %    @flow {1959:LAST@   Intake/Output from previous day:   11/17 0701 - 11/18 0700 In: 3118.8 [I.V.:3118.8] Out: 1050 [Urine:200; Drains:750]   Intake/Output this shift:       Intake/Output     11/17 0701 - 11/18 0700 11/18 0701 - 11/19 0700   I.V. 3118.8    Total Intake 3118.8     Urine 200    Drains 750    Blood 100    Total Output 1050     Net +2068.8             LABORATORY DATA:  Recent Labs  10/06/13 1630  WBC 12.2*  HGB 12.1*  HCT 35.1*  PLT 248    Recent Labs  10/06/13 1630  CREATININE 0.98   Lab Results  Component Value Date   INR 0.94 09/23/2013   INR 0.96 06/27/2013    Examination:  General appearance: alert, cooperative and no distress Extremities: Homans sign is negative, no  sign of DVT  Wound Exam: clean, dry, intact   Drainage:  None: wound tissue dry  Motor Exam: EHL and FHL Intact  Sensory Exam: Deep Peroneal normal   Assessment:    1 Day Post-Op  Procedure(s) (LRB): TOTAL KNEE REVISION (Right)  ADDITIONAL DIAGNOSIS:  Active Problems:   * No active hospital problems. *  Acute Blood Loss Anemia   Plan: Physical Therapy as ordered Weight Bearing as Tolerated (WBAT)  DVT Prophylaxis:  Lovenox  DISCHARGE PLAN: Home  DISCHARGE NEEDS: HHPT, CPM, Walker and 3-in-1 comode seat         Timisha Mondry 10/07/2013, 7:29 AM

## 2013-10-07 NOTE — Progress Notes (Signed)
Patient d/c to home with family.  HHPT set up.  IV removed.  Prescriptions given and instructions reviewed.

## 2013-10-07 NOTE — Evaluation (Signed)
Occupational Therapy Evaluation Patient Details Name: [name] MRN: [id number] DOB: 09/19/1946 Today's Date: 10/07/2013 Time: 8413-2440 OT Time Calculation (min): 18 min  OT Assessment / Plan / Recommendation History of present illness Patient is a 67 yo male s/p Rt TKA revision.  Patient has had 2 joint replacements on each knee.   Clinical Impression   Pt presents w/ dx as above. He plans to d/c home later today w/ wife assist PRN. He has DME & has had multiple knee replacements in the past. Pt/wife were instructed in shower transfers. Both patient & his wife report no further acute OT needs at this time. Will sign off.    OT Assessment  Patient does not need any further OT services    Follow Up Recommendations  No OT follow up    Barriers to Discharge      Equipment Recommendations  None recommended by OT    Recommendations for Other Services    Frequency       Precautions / Restrictions Precautions Precautions: Knee Precaution Comments: Reviewed precautions with patient. Restrictions Weight Bearing Restrictions: Yes RLE Weight Bearing: Weight bearing as tolerated   Pertinent Vitals/Pain 6/10 R LE/knee. Pt reports he was recently premedicated. Repositioned, rest, ice after therapy session today.    ADL  Eating/Feeding: Performed;Independent Where Assessed - Eating/Feeding: Chair Grooming: Performed;Wash/dry hands;Supervision/safety Where Assessed - Grooming: Supported standing;Unsupported standing Upper Body Bathing: Simulated;Set up Where Assessed - Upper Body Bathing: Supported sitting;Unsupported sitting Lower Body Bathing: Simulated;Minimal assistance Where Assessed - Lower Body Bathing: Supported sit to stand Upper Body Dressing: Simulated;Set up Where Assessed - Upper Body Dressing: Unsupported sitting Lower Body Dressing: Performed;Minimal assistance Where Assessed - Lower Body Dressing: Supported sit to stand Toilet Transfer:  Performed;Supervision/safety (Ambulated into bathroom on/off 3:1 over toilet) Toilet Transfer Method: Sit to stand Toilet Transfer Equipment: Raised toilet seat with arms (or 3-in-1 over toilet) Toileting - Clothing Manipulation and Hygiene: Performed;Supervision/safety Where Assessed - Toileting Clothing Manipulation and Hygiene: Sit to stand from 3-in-1 or toilet Tub/Shower Transfer: Other (comment);Simulated (Pt w/ walk in shower w/ chair, handout issued/reviewed ) Tub/Shower Transfer Method: Ambulating Tub/Shower Transfer Equipment: Walk in shower Equipment Used: Rolling walker;Other (comment) (3:1 over toilet) Transfers/Ambulation Related to ADLs: Pt currently supervision level for transfers related to OT/ADL's. Occasional vc's for safety/hand placement w/ RW. ADL Comments: Pt/wife were educated in role of OT. Handout was issued & reviewed for shower transfers. Discussed posterior entrance into shower, pt/wife verbalized understanding of this "That's how I did it before I think" per pt. Pt states he has DME, plans to have wife assist PRN for LB ADL's & states no further needs at this time.    OT Diagnosis:    OT Problem List:   OT Treatment Interventions:     OT Goals(Current goals can be found in the care plan section) Acute Rehab OT Goals Patient Stated Goal: To go home w/ wife PRN assist  Visit Information  Last OT Received On: 10/07/13 Assistance Needed: +1 History of Present Illness: Patient is a 67 yo male s/p Rt TKA revision.  Patient has had 2 joint replacements on each knee.       Prior Functioning     Home Living Family/patient expects to be discharged to:: Private residence Living Arrangements: Spouse/significant other Available Help at Discharge: Family;Available 24 hours/day Type of Home: House Home Access: Stairs to enter [employer] of Steps: 1 Entrance Stairs-Rails: None Home Layout: One level Home Equipment: Walker - 2 wheels;Bedside  commode;Shower seat Prior Function Level of Independence: Independent Communication Communication: No difficulties Dominant Hand: Left    Vision/Perception Vision - History Baseline Vision: Wears glasses all the time Patient Visual Report: No change from baseline   Cognition  Cognition Arousal/Alertness: Awake/alert Behavior During Therapy: WFL for tasks assessed/performed Overall Cognitive Status: Within Functional Limits for tasks assessed    Extremity/Trunk Assessment Upper Extremity Assessment Upper Extremity Assessment: Overall WFL for tasks assessed Lower Extremity Assessment Lower Extremity Assessment: Defer to PT evaluation Cervical / Trunk Assessment Cervical / Trunk Assessment: Normal    Mobility Bed Mobility Bed Mobility: Not assessed Details for Bed Mobility Assistance: Pt up in chair upon arrival Transfers Transfers: Sit to Stand;Stand to Sit Sit to Stand: 5: Supervision;From chair/3-in-1;With armrests Stand to Sit: 5: Supervision;To chair/3-in-1;With armrests Details for Transfer Assistance: VC's for safety/sequencing & hand placement noted.      Balance Balance Balance Assessed: Yes Static Sitting Balance Static Sitting - Balance Support: No upper extremity supported;Feet supported Static Standing Balance Static Standing - Balance Support: No upper extremity supported;During functional activity (Standing at sink for grooming tasks x5 min)   End of Session OT - End of Session Equipment Utilized During Treatment: Rolling walker Activity Tolerance: Patient tolerated treatment well Patient left: in chair;with call bell/phone within reach;with family/visitor present  GO     Charletta Cousin, Amy Beth Dixon 10/07/2013, 10:20 AM

## 2013-10-07 NOTE — Progress Notes (Signed)
Physical Therapy Treatment Patient Details Name: [name] MRN: [id number] DOB: 03-01-1946 Today's Date: 10/07/2013 Time: 1191-4782 PT Time Calculation (min): 25 min  PT Assessment / Plan / Recommendation  History of Present Illness Patient is a 67 yo male s/p Rt TKA revision.  Patient has had 2 joint replacements on each knee.   PT Comments   Pt moving well.  Increased ambulation distance & completed stair training this session.  Pt eager to d/c home today.  Safe to d/c home from mobility standpoint.     Follow Up Recommendations  Home health PT;Supervision/Assistance - 24 hour     Does the patient have the potential to tolerate intense rehabilitation     Barriers to Discharge        Equipment Recommendations  None recommended by PT    Recommendations for Other Services    Frequency 7X/week   Progress towards PT Goals Progress towards PT goals: Progressing toward goals  Plan Current plan remains appropriate    Precautions / Restrictions Precautions Precautions: Knee Restrictions RLE Weight Bearing: Weight bearing as tolerated   Pertinent Vitals/Pain 7/10 Rt knee.  Premedicated.  Repositioned for comfort.      Mobility  Bed Mobility Bed Mobility: Not assessed Details for Bed Mobility Assistance: Pt sitting on EOB upon arrival Transfers Transfers: Sit to Stand;Stand to Sit Sit to Stand: 4: Min guard;With upper extremity assist;From bed Stand to Sit: 4: Min guard;With upper extremity assist;With armrests;To chair/3-in-1 Details for Transfer Assistance: cues to reinforce hand placement Ambulation/Gait Ambulation/Gait Assistance: 4: Min guard Ambulation Distance (Feet): 150 Feet Assistive device: Rolling walker Ambulation/Gait Assistance Details: cues for sequencing, increase heel strike RLE, terminal knee extension, & to stay closer to RW especially with turns Gait Pattern: Step-through pattern;Decreased weight shift to right Stairs: Yes Stairs Assistance: 4:  Min guard Stair Management Technique: No rails;Step to pattern;Forwards;With walker Number of Stairs: 1 Wheelchair Mobility Wheelchair Mobility: No    Exercises Total Joint Exercises Ankle Circles/Pumps: AROM;Both;10 reps Quad Sets: AROM;Strengthening;Both;10 reps Straight Leg Raises: AROM;Strengthening;Right;10 reps Long Arc Quad: AAROM;Strengthening;Right;10 reps ((A) to reach full ROM) Knee Flexion: AAROM;Right;5 reps;Seated Goniometric ROM: AAROM Rt knee flexion ~90 degrees in sitting     PT Goals (current goals can now be found in the care plan section) Acute Rehab PT Goals PT Goal Formulation: With patient/family Time For Goal Achievement: 10/13/13 Potential to Achieve Goals: Good  Visit Information  Last PT Received On: 10/07/13 Assistance Needed: +1 History of Present Illness: Patient is a 67 yo male s/p Rt TKA revision.  Patient has had 2 joint replacements on each knee.    Subjective Data      Cognition  Cognition Arousal/Alertness: Awake/alert Behavior During Therapy: WFL for tasks assessed/performed Overall Cognitive Status: Within Functional Limits for tasks assessed    Balance     End of Session PT - End of Session Equipment Utilized During Treatment: Gait belt Activity Tolerance: Patient tolerated treatment well Patient left: in chair;with call bell/phone within reach;with family/visitor present Nurse Communication: Mobility status   GP     Lara Mulch 10/07/2013, 9:08 AM  Verdell Face, PTA [phone] 10/07/2013

## 2013-10-07 NOTE — Progress Notes (Signed)
10/07/13 Set up with HHPT with Leonel Ramsay by MD office. Spoke with patient, no change in discharge plan.[employer] providing CPM, patient states that he has rolling walker and 3N1. No other discharge needs identiified. Jacquelynn Cree RN, BSN, CCM

## 2013-10-08 NOTE — Op Note (Signed)
[name], [name] NO.:  [id number]  MEDICAL RECORD NO.:  [id number]  LOCATION:  [id number]                        FACILITY:  [facility]  PHYSICIAN:  Mila Homer. Sherlean Foot, M.D. DATE OF BIRTH:  22-Feb-1946  DATE OF PROCEDURE:  10/06/2013 DATE OF DISCHARGE:  10/07/2013                              OPERATIVE REPORT   SURGEON:  Mila Homer. Sherlean Foot, MD  ASSISTANT:  Altamese Cabal, PA-C.  ANESTHESIA:  General.  PREOPERATIVE DIAGNOSES:  Right knee failed total knee arthroplasty.  POSTOP:  Right knee failed total knee arthroplasty.  PROCEDURE:  Complete right knee arthroplasty revision.  INDICATION FOR PROCEDURE:  The patient is a 67 year old white male, 7 years status post a primary knee replacement.  Loosening was found on x- ray and bone scan.  Informed consent obtained.  DESCRIPTION OF PROCEDURE:  The patient was laid supine, administered general anesthesia.  The right leg prepped and draped in usual sterile fashion.  Extremity was exsanguinated with the Esmarch.  I then made a midline incision with #10 blade.  New blade was used and parapatellar arthrotomy was performed.  There was incredible amount of synovitis, obvious polysynovitis.  I then completed the synovectomy into the gutters.  I then had to extend the quad tendon incision up to the very apex.  At this point, I then was able to sublux the patella.  I removed the poly button off the metal-backed patella.  I then flexed the knee up and amputated toes off the tibial articulating surface.  I removed the polyethylene.  I then rongeured around the edges of the femoral component and was easily tamped the femoral component off.  I then removed all debris and bone loss and was a type A defect in the femur only, very contained.  I then turned my attention to the tibia where I then used micro saw to free up the metal cement interface on the tibia and removed the tibial component in extension.  I then removed all the cement  with 0.25 inch stem, small mallet, and rongeurs.  I then completed the removal of all debris out of the tibia as well.  I then reamed the femur to a 15, the tibia to a 12.  I then sizably sized to a G and put on the 15 stem and a cut for the notch.  I then used a 5-mm posterior medial augment, put that in place.  I then elected to place two 5 mm augments after cutting that couple spikes of bone distally there in order to get better fit and fill distally as well.  This re- created the femur pretty well.  Also, there was a large cystic anterior defect which I felt like I could deal with cement.  I then removed the femoral component.  I then put the 12 intramedullary reamer back in the tibia and then used the extramedullary alignment guide to make a cut beneath the cement interface level.  I then prepared the tibia with a 5 tray drilling keel.  I then trialed with a 5 tibia, 12 stem, a G femur with two 5 distal augments and a 5 distal medial augment, and a size 15 stem.  This  afforded excellent re-creation of the bony anatomy and then I had to use a size 20 CCK insert and this worked very well, offered excellent stability and motion.  I then removed all trial components, copiously irrigated.  I then cemented with Palacos R cement, all components  removed and excess cement.  I snapped in the 20 mm CCK insert and screwed that into place.  Relocated and allowed the cement to harden in extension.  I snapped back on a new polyethylene behind the patella.  I then let tourniquet down, obtained hemostasis.  I placed a Hemovac coming out superolaterally and deep to the arthrotomy.  I then closed the arthrotomy with figure-of-eight #1 Vicryl sutures.  Deep soft tissue buried with 0 Vicryl sutures, subcuticular 2-0 Vicryl stitch, and skin staples.  Dressed with Xeroform, dressing sponges, sterile Webril, and stocking.  COMPLICATIONS:  None.  DRAINS:  One Hemovac.  ESTIMATED BLOOD LOSS:  300  mL.  TOURNIQUET TIME:  96 minutes.          ______________________________ Mila Homer Sherlean Foot, M.D.     SDL/MEDQ  D:  10/07/2013  T:  10/08/2013  Job:  [id number]

## 2013-10-10 ENCOUNTER — Encounter (HOSPITAL_COMMUNITY): Payer: Self-pay | Admitting: Orthopedic Surgery

## 2013-10-14 NOTE — Discharge Summary (Signed)
SPORTS MEDICINE & JOINT REPLACEMENT   Georgena Spurling, MD   Altamese Cabal, PA-C [address] [place], [place], [place]  [address]                             [phone]  PATIENT ID: [name]        MRN:  [id number]          DOB/AGE: July 14, 1946 / 67 y.o.    DISCHARGE SUMMARY  ADMISSION DATE:    10/06/2013 DISCHARGE DATE:  10/07/2013   ADMISSION DIAGNOSIS: loosening/failed right total knee    DISCHARGE DIAGNOSIS:  loosening/failed right total knee    ADDITIONAL DIAGNOSIS: Active Problems:   * No active hospital problems. *  Past Medical History  Diagnosis Date  . Hypertension   . Myocardial infarction     15 yrs. ago  . Pneumonia     11/2012  . Arthritis     knees  . Kidney stone   . Fissure, anal 11/2012    PROCEDURE: Procedure(s): TOTAL KNEE REVISION on 10/06/2013  CONSULTS:     HISTORY:  See H&P in chart  HOSPITAL COURSE:  DMARIO RUSSOM is a 67 y.o. admitted on 10/06/2013 and found to have a diagnosis of loosening/failed right total knee.  After appropriate laboratory studies were obtained  they were taken to the operating room on 10/06/2013 and underwent Procedure(s): TOTAL KNEE REVISION.   They were given perioperative antibiotics:  Anti-infectives   Start     Dose/Rate Route Frequency Ordered Stop   10/06/13 1700  ceFAZolin (ANCEF) IVPB 2 g/50 mL premix     2 g 100 mL/hr over 30 Minutes Intravenous Every 6 hours 10/06/13 1517 10/06/13 2302   10/06/13 0814  ceFAZolin (ANCEF) 2-3 GM-% IVPB SOLR    Comments:  Scronce, Trina   : cabinet override      10/06/13 0814 10/06/13 1058   10/05/13 1100  ceFAZolin (ANCEF) IVPB 2 g/50 mL premix  Status:  Discontinued     2 g 100 mL/hr over 30 Minutes Intravenous On call to O.R. 10/05/13 1059 10/06/13 1517    .  Tolerated the procedure well.  Placed with a foley intraoperatively.  Given Ofirmev at induction and for 48 hours.    POD# 1: Vital signs were stable.  Patient denied Chest pain, shortness of  breath, or calf pain.  Patient was started on Lovenox 30 mg subcutaneously twice daily at 8am.  Consults to PT, OT, and care management were made.  The patient was weight bearing as tolerated.  CPM was placed on the operative leg 0-90 degrees for 6-8 hours a day.  Incentive spirometry was taught.  Dressing was changed.  Marcaine pump and hemovac were discontinued.      POD #2, Continued  PT for ambulation and exercise program.  IV saline locked.  O2 discontinued.    The remainder of the hospital course was dedicated to ambulation and strengthening.   The patient was discharged on 1 day post op in  Good condition.  Blood products given:none  DIAGNOSTIC STUDIES: Recent vital signs: No data found.      Recent laboratory studies: No results found for this basename: WBC, HGB, HCT, PLT,  in the last 168 hours No results found for this basename: NA, K, CL, CO2, BUN, CREATININE, GLUCOSE, CALCIUM,  in the last 168 hours Lab Results  Component Value Date   INR 0.94 09/23/2013   INR  0.96 06/27/2013     Recent Radiographic Studies :  Dg Chest 2 View  09/23/2013   CLINICAL DATA:  Preoperative evaluation for right knee revision, history hypertension, asthma high  EXAM: CHEST  2 VIEW  COMPARISON:  07/07/2013  FINDINGS: Normal heart size, mediastinal contours, and pulmonary vascularity.  Lungs clear.  No pleural effusion or pneumothorax.  Question nipple shadow seen on previous exam no longer identified.  Minimal atherosclerotic calcification aortic arch.  No acute osseous findings.  IMPRESSION: No acute abnormalities.   Electronically Signed   By: Ulyses Southward M.D.   On: 09/23/2013 16:50    DISCHARGE INSTRUCTIONS: Discharge Orders   Future Orders Complete By Expires   Call MD / Call 911  As directed    Comments:     If you experience chest pain or shortness of breath, CALL 911 and be transported to the hospital emergency room.  If you develope a fever above 101 F, pus (white drainage) or increased  drainage or redness at the wound, or calf pain, call your surgeon's office.   Change dressing  As directed    Comments:     Change dressing on wednesday, then change the dressing daily with sterile 4 x 4 inch gauze dressing and apply TED hose.   Constipation Prevention  As directed    Comments:     Drink plenty of fluids.  Prune juice may be helpful.  You may use a stool softener, such as Colace (over the counter) 100 mg twice a day.  Use MiraLax (over the counter) for constipation as needed.   CPM  As directed    Comments:     Continuous passive motion machine (CPM):      Use the CPM from 0 to 90 for 6-8 hours per day.      You may increase by 10 per day.  You may break it up into 2 or 3 sessions per day.      Use CPM for 2 weeks or until you are told to stop.   Diet - low sodium heart healthy  As directed    Do not put a pillow under the knee. Place it under the heel.  As directed    Driving restrictions  As directed    Comments:     No driving for 6 weeks   Increase activity slowly as tolerated  As directed    Lifting restrictions  As directed    Comments:     No lifting for 6 weeks   TED hose  As directed    Comments:     Use stockings (TED hose) for 3 weeks on both leg(s).  You may remove them at night for sleeping.      DISCHARGE MEDICATIONS:     Medication List    STOP taking these medications       aspirin EC 81 MG tablet      TAKE these medications       acetaminophen 650 MG CR tablet  Commonly known as:  TYLENOL  Take 650-1,300 mg by mouth every 8 (eight) hours as needed for pain.     atenolol 25 MG tablet  Commonly known as:  TENORMIN  Take 25 mg by mouth daily.     atorvastatin 40 MG tablet  Commonly known as:  LIPITOR  Take 40 mg by mouth daily.     enalapril 20 MG tablet  Commonly known as:  VASOTEC  Take 20 mg by mouth daily.  enoxaparin 40 MG/0.4ML injection  Commonly known as:  LOVENOX  Inject 0.4 mLs (40 mg total) into the skin daily.      fenofibrate 54 MG tablet  Take 54 mg by mouth daily.     hydrochlorothiazide 25 MG tablet  Commonly known as:  HYDRODIURIL  Take 12.5 mg by mouth daily.     methocarbamol 500 MG tablet  Commonly known as:  ROBAXIN  Take 1-2 tablets (500-1,000 mg total) by mouth every 6 (six) hours as needed for muscle spasms.     nitroGLYCERIN 0.4 MG SL tablet  Commonly known as:  NITROSTAT  Place 0.4 mg under the tongue every 5 (five) minutes as needed for chest pain.     oxyCODONE 5 MG immediate release tablet  Commonly known as:  Oxy IR/ROXICODONE  Take 1-2 tablets (5-10 mg total) by mouth every 3 (three) hours as needed for breakthrough pain.     OxyCODONE 10 mg T12a 12 hr tablet  Commonly known as:  OXYCONTIN  Take 1 tablet (10 mg total) by mouth every 12 (twelve) hours.        FOLLOW UP VISIT:       Follow-up Information   Follow up with Raymon Mutton, MD. Call on 10/21/2013.   Specialty:  Orthopedic Surgery   Contact information:   [address] [place] [address] [phone]       DISPOSITION: HOME   CONDITION:  Good   Jasiyah Paulding 10/14/2013, 10:48 AM

## 2014-08-17 ENCOUNTER — Other Ambulatory Visit: Payer: Self-pay | Admitting: Orthopedic Surgery

## 2014-08-17 DIAGNOSIS — M2560 Stiffness of unspecified joint, not elsewhere classified: Secondary | ICD-10-CM

## 2014-08-28 ENCOUNTER — Ambulatory Visit
Admission: RE | Admit: 2014-08-28 | Discharge: 2014-08-28 | Disposition: A | Discharge status: Home / Self Care | Payer: PRIVATE HEALTH INSURANCE | Source: Ambulatory Visit | Attending: Orthopedic Surgery | Admitting: Orthopedic Surgery

## 2014-08-28 ENCOUNTER — Other Ambulatory Visit: Payer: Self-pay | Admitting: Orthopedic Surgery

## 2014-08-28 DIAGNOSIS — T1590XA Foreign body on external eye, part unspecified, unspecified eye, initial encounter: Secondary | ICD-10-CM

## 2014-08-28 DIAGNOSIS — M2560 Stiffness of unspecified joint, not elsewhere classified: Secondary | ICD-10-CM

## 2014-08-28 MED ORDER — IOHEXOL 180 MG/ML  SOLN
15.0000 mL | Freq: Once | INTRAMUSCULAR | Status: AC | PRN
  Administered 2014-08-28: 15 mL via INTRA_ARTICULAR

## 2016-08-25 ENCOUNTER — Other Ambulatory Visit: Payer: Self-pay | Admitting: Orthopedic Surgery

## 2016-08-25 ENCOUNTER — Other Ambulatory Visit: Payer: PRIVATE HEALTH INSURANCE

## 2016-08-25 DIAGNOSIS — M542 Cervicalgia: Secondary | ICD-10-CM

## 2016-08-25 DIAGNOSIS — M25511 Pain in right shoulder: Secondary | ICD-10-CM

## 2016-09-04 ENCOUNTER — Ambulatory Visit
Admission: RE | Admit: 2016-09-04 | Discharge: 2016-09-04 | Disposition: A | Discharge status: Home / Self Care | Payer: Medicare Other | Source: Ambulatory Visit | Attending: Orthopedic Surgery | Admitting: Orthopedic Surgery

## 2016-09-04 DIAGNOSIS — M25511 Pain in right shoulder: Secondary | ICD-10-CM

## 2016-09-04 DIAGNOSIS — M542 Cervicalgia: Secondary | ICD-10-CM

## 2019-10-31 ENCOUNTER — Other Ambulatory Visit: Payer: Self-pay | Admitting: Neurosurgery

## 2019-10-31 DIAGNOSIS — M542 Cervicalgia: Secondary | ICD-10-CM

## 2019-11-07 ENCOUNTER — Ambulatory Visit
Admission: RE | Admit: 2019-11-07 | Discharge: 2019-11-07 | Disposition: A | Discharge status: Home / Self Care | Payer: Medicare Other | Source: Ambulatory Visit | Attending: Neurosurgery | Admitting: Neurosurgery

## 2019-11-07 ENCOUNTER — Other Ambulatory Visit: Payer: Self-pay

## 2019-11-07 DIAGNOSIS — M542 Cervicalgia: Secondary | ICD-10-CM

## 2019-11-20 ENCOUNTER — Other Ambulatory Visit: Payer: Medicare Other
